# Patient Record
Sex: Female | Born: 1970 | Race: White | Hispanic: No | Smoking: Former smoker
Health system: Southern US, Community
[De-identification: ages and names within clinical notes are randomized; demographics above are authoritative.]

## PROBLEM LIST (undated history)

## (undated) DIAGNOSIS — K219 Gastro-esophageal reflux disease without esophagitis: Secondary | ICD-10-CM

## (undated) DIAGNOSIS — F32A Depression, unspecified: Secondary | ICD-10-CM

## (undated) DIAGNOSIS — D649 Anemia, unspecified: Secondary | ICD-10-CM

## (undated) DIAGNOSIS — F988 Other specified behavioral and emotional disorders with onset usually occurring in childhood and adolescence: Secondary | ICD-10-CM

## (undated) DIAGNOSIS — J45909 Unspecified asthma, uncomplicated: Secondary | ICD-10-CM

## (undated) DIAGNOSIS — F329 Major depressive disorder, single episode, unspecified: Secondary | ICD-10-CM

## (undated) HISTORY — PX: BLADDER SURGERY: SHX569

## (undated) HISTORY — DX: Gastro-esophageal reflux disease without esophagitis: K21.9

## (undated) HISTORY — DX: Unspecified asthma, uncomplicated: J45.909

## (undated) HISTORY — DX: Major depressive disorder, single episode, unspecified: F32.9

## (undated) HISTORY — DX: Depression, unspecified: F32.A

## (undated) HISTORY — DX: Anemia, unspecified: D64.9

## (undated) HISTORY — PX: MIDDLE EAR SURGERY: SHX713

## (undated) HISTORY — PX: EXTERNAL EAR SURGERY: SHX627

## (undated) HISTORY — PX: OTHER SURGICAL HISTORY: SHX169

## (undated) HISTORY — DX: Other specified behavioral and emotional disorders with onset usually occurring in childhood and adolescence: F98.8

## (undated) HISTORY — PX: ADENOIDECTOMY: SUR15

## (undated) HISTORY — PX: INNER EAR SURGERY: SHX679

---

## 1992-07-17 HISTORY — PX: BREAST CYST EXCISION: SHX579

## 2004-01-20 ENCOUNTER — Encounter: Admission: RE | Admit: 2004-01-20 | Discharge: 2004-01-20 | Payer: Self-pay | Admitting: Internal Medicine

## 2013-05-28 ENCOUNTER — Telehealth: Payer: Self-pay | Admitting: *Deleted

## 2013-05-28 MED ORDER — AMPHETAMINE-DEXTROAMPHETAMINE 20 MG PO TABS: 20.0000 mg | ORAL_TABLET | Freq: Two times a day (BID) | ORAL | Status: DC

## 2013-05-28 NOTE — Telephone Encounter (Signed)
REFILL = ADDERALL 20MG    CALL PT WHEN READY .

## 2013-06-25 ENCOUNTER — Telehealth: Payer: Self-pay

## 2013-06-25 ENCOUNTER — Other Ambulatory Visit: Payer: Self-pay | Admitting: Physician Assistant

## 2013-06-25 MED ORDER — AMPHETAMINE-DEXTROAMPHETAMINE 20 MG PO TABS: 20.0000 mg | ORAL_TABLET | Freq: Two times a day (BID) | ORAL | Status: DC

## 2013-06-25 NOTE — Telephone Encounter (Signed)
Refill is ready for pt to pick up. lmom to pt.

## 2013-07-14 ENCOUNTER — Encounter: Payer: Self-pay | Admitting: Internal Medicine

## 2013-07-15 ENCOUNTER — Ambulatory Visit: Payer: Self-pay | Admitting: Emergency Medicine

## 2013-07-16 ENCOUNTER — Ambulatory Visit (INDEPENDENT_AMBULATORY_CARE_PROVIDER_SITE_OTHER): Payer: Self-pay | Admitting: Physician Assistant

## 2013-07-16 ENCOUNTER — Encounter: Payer: Self-pay | Admitting: Physician Assistant

## 2013-07-16 VITALS — BP 122/78 | HR 84 | Temp 99.1°F | Resp 16 | Ht 64.5 in | Wt 210.0 lb

## 2013-07-16 DIAGNOSIS — J019 Acute sinusitis, unspecified: Secondary | ICD-10-CM

## 2013-07-16 MED ORDER — BENZONATATE 100 MG PO CAPS: 100.0000 mg | ORAL_CAPSULE | Freq: Four times a day (QID) | ORAL | Status: DC | PRN

## 2013-07-16 MED ORDER — PREDNISONE 20 MG PO TABS: ORAL_TABLET | ORAL | Status: DC

## 2013-07-16 MED ORDER — AZITHROMYCIN 250 MG PO TABS: ORAL_TABLET | ORAL | Status: DC

## 2013-07-16 NOTE — Patient Instructions (Signed)

## 2013-07-16 NOTE — Progress Notes (Signed)
   Subjective:    Patient ID: Vanessa Lee, female    DOB: February 12, 1971, 42 y.o.   MRN: [id number]  Sinusitis This is a new problem. Episode onset: sunday. The problem has been gradually worsening since onset. There has been no fever (not checking). She is experiencing no pain. Associated symptoms include chills, congestion, coughing, ear pain, shortness of breath, sinus pressure, sneezing, a sore throat and swollen glands. Pertinent negatives include no diaphoresis, headaches, hoarse voice or neck pain. Past treatments include acetaminophen, saline sprays and spray decongestants. The treatment provided no relief.    Review of Systems  Constitutional: Positive for chills and fatigue. Negative for fever and diaphoresis.  HENT: Positive for congestion, ear pain, sinus pressure, sneezing and sore throat. Negative for hoarse voice, mouth sores, nosebleeds, postnasal drip, rhinorrhea, tinnitus, trouble swallowing and voice change.   Eyes: Negative.   Respiratory: Positive for cough and shortness of breath. Negative for chest tightness, wheezing and stridor.   Cardiovascular: Negative.   Gastrointestinal: Negative.   Genitourinary: Negative.   Musculoskeletal: Positive for myalgias. Negative for neck pain.  Neurological: Negative.  Negative for headaches.       Objective:   Physical Exam  Constitutional: She appears well-developed and well-nourished.  HENT:  Head: Normocephalic and atraumatic.  Right Ear: External ear normal.  Nose: Right sinus exhibits frontal sinus tenderness. Left sinus exhibits frontal sinus tenderness.  Eyes: Conjunctivae and EOM are normal.  Neck: Normal range of motion. Neck supple.  Cardiovascular: Normal rate, regular rhythm, normal heart sounds and intact distal pulses.   Pulmonary/Chest: Effort normal and breath sounds normal. No respiratory distress. She has no wheezes.  Abdominal: Soft. Bowel sounds are normal.  Lymphadenopathy:    She has cervical adenopathy.   Skin: Skin is warm and dry.      Assessment & Plan:  Acute sinusitis, unspecified - Plan: azithromycin (ZITHROMAX) 250 MG tablet, predniSONE (DELTASONE) 20 MG tablet, benzonatate (TESSALON PERLES) 100 MG capsule  Will hold the antibiotic and treat symptoms for now.

## 2013-08-04 ENCOUNTER — Other Ambulatory Visit: Payer: Self-pay | Admitting: Physician Assistant

## 2013-08-04 MED ORDER — AMPHETAMINE-DEXTROAMPHETAMINE 20 MG PO TABS: 20.0000 mg | ORAL_TABLET | Freq: Two times a day (BID) | ORAL | Status: DC

## 2013-08-21 ENCOUNTER — Ambulatory Visit: Payer: Self-pay | Admitting: Physician Assistant

## 2013-08-25 ENCOUNTER — Encounter: Payer: Self-pay | Admitting: Physician Assistant

## 2013-08-25 ENCOUNTER — Ambulatory Visit (INDEPENDENT_AMBULATORY_CARE_PROVIDER_SITE_OTHER): Payer: Self-pay | Admitting: Physician Assistant

## 2013-08-25 VITALS — BP 110/72 | HR 80 | Temp 98.1°F | Resp 16 | Ht 64.5 in | Wt 198.0 lb

## 2013-08-25 DIAGNOSIS — F988 Other specified behavioral and emotional disorders with onset usually occurring in childhood and adolescence: Secondary | ICD-10-CM

## 2013-08-25 DIAGNOSIS — J0100 Acute maxillary sinusitis, unspecified: Secondary | ICD-10-CM

## 2013-08-25 MED ORDER — FLUCONAZOLE 150 MG PO TABS: 150.0000 mg | ORAL_TABLET | Freq: Every day | ORAL | Status: DC

## 2013-08-25 MED ORDER — SULFAMETHOXAZOLE-TMP DS 800-160 MG PO TABS: 1.0000 | ORAL_TABLET | Freq: Two times a day (BID) | ORAL | Status: DC

## 2013-08-25 MED ORDER — PREDNISONE 20 MG PO TABS: ORAL_TABLET | ORAL | Status: DC

## 2013-08-25 MED ORDER — AMPHETAMINE-DEXTROAMPHETAMINE 20 MG PO TABS: 20.0000 mg | ORAL_TABLET | Freq: Two times a day (BID) | ORAL | Status: DC

## 2013-08-25 NOTE — Patient Instructions (Signed)
Please take the prednisone to help decrease inflammation and therefore decrease symptoms. Take it it with food to avoid GI upset. It can cause increased energy but on the other hand it can make it hard to sleep at night so please take it in the morning.  It is not an antibiotic so you can stop it early if you are feeling better.  If you are diabetic it will increase your sugars.   The majority of colds are caused by viruses and do not require antibiotics. Please read the rest of this hand out to learn more about the common cold and what you can do to help yourself as well as help prevent the over use of antibiotics.   COMMON COLD SIGNS AND SYMPTOMS - The common cold usually causes nasal congestion, runny nose, and sneezing. A sore throat may be present on the first day but usually resolves quickly. If a cough occurs, it generally develops on about the fourth or fifth day of symptoms, typically when congestion and runny nose are resolving  COMMON COLD COMPLICATIONS - In most cases, colds do not cause serious illness or complications. Most colds last for three to seven days, although many people continue to have symptoms (coughing, sneezing, congestion) for up to two weeks.  One of the more common complications is sinusitis, which is usually caused by viruses and rarely (about 2 percent of the time) by bacteria. Having thick or yellow to green-colored nasal discharge does not mean that bacterial sinusitis has developed; discolored nasal discharge is a normal phase of the common cold.  Lower respiratory infections, such as pneumonia or bronchitis, may develop following a cold.  Infection of the middle ear, or otitis media, can accompany or follow a cold.  COMMON COLD TREATMENT - There is no specific treatment for the viruses that cause the common cold. Most treatments are aimed at relieving some of the symptoms of the cold, but do not shorten or cure the cold. Antibiotics are not useful for treating the  common cold; antibiotics are only used to treat illnesses caused by bacteria, not viruses. Unnecessary use of antibiotics for the treatment of the common cold can cause allergic reactions, diarrhea, or other gastrointestinal symptoms in some patients.  The symptoms of a cold will resolve over time, even without any treatment. People with underlying medical conditions and those who use other over-the-counter or prescription medications should speak with their healthcare provider or pharmacist to ensure that it is safe to use these treatments. The following are treatments that may reduce the symptoms caused by the common cold.  Nasal congestion - Decongestants are good for nasal congestion- if you feel very stuffy but no mucus is coming out, this is the medication that will help you the most.  Pseudoephedrine is a decongestant that can improve nasal congestion. Although a prescription is not required, drugstores in the [place] keep pseudoephedrine behind the counter, so it must be requested from a pharmacist. If you have a heart condition or high blood pressure please use Coricidin BPH instead.   Runny nose - Antihistamines such as diphenhydramine (Benadryl), certazine (Zyrtec) which are best taking at night because they can make you tired OR loratadine (Claritin),  fexafinadine (Allegra) help with a runny nose.   Nasal sprays such an oxymetazoline (Afrin and others) may also give temporary relief of nasal congestion. However, these sprays should never be used for more than two to three days; use for more than three days use can worsen congestion.    Nasocort is now over the counter and can help decrease a runny nose. Please stop the medication if you have blurry vision or nose bleeds.   Sore throat and headache - Sore throat and headache are best treated with a mild pain reliever such as acetaminophen (Tylenol) or a non-steroidal anti-inflammatory agent such as ibuprofen or naproxen (Motrin or Aleve).  These medications should be taken with food to prevent stomach problems. As well as gargling with warm water and salt.   Cough - Common cough medicine ingredients include guaifenesin and dextromethorphan; these are often combined with other medications in over-the-counter cold formulas. Often a cough is worse at night or first in the morning due to post nasal drip from you nose. You can try to sleep at an angle to decrease a cough.   Alternative treatments - Heated, humidified air can improve symptoms of nasal congestion and runny nose, and causes few to no side effects. A number of alternative products, including vitamin C, doubling up on your vitamin D and herbal products such as echinacea, may help. Certain products, such as nasal gels that contain zinc (eg, Zicam), have been associated with a permanent loss of smell.  Antibiotics - Antibiotics should not be used to treat an uncomplicated common cold. As noted above, colds are caused by viruses. Antibiotics treat bacterial, not viral infections. Some viruses that cause the common cold can also depress the immune system or cause swelling in the lining of the nose or airways; this can, in turn, lead to a bacterial infection. Often you need to give your body 7 days to fight off a common cold while treating the symptoms with the medications listed above. If after 7 days your symptoms are not improving, you are getting worse, you have shortness of breath, chest pain, a fever of over 103 you should seek medical help immediately.   PREVENTION IS THE BEST MEDICINE - Hand washing is an essential and highly effective way to prevent the spread of infection.  Alcohol-based hand rubs are a good alternative for disinfecting hands if a sink is not available.  Hands should be washed before preparing food and eating and after coughing, blowing the nose, or sneezing. While it is not always possible to limit contact with people who may be infected with a cold, touching  the eyes, nose, or mouth after direct contact should be avoided when possible. Sneezing/coughing into the sleeve of one's clothing (at the inner elbow) is another means of containing sprays of saliva and secretions and does not contaminate the hands.   Smoking Cessation Quitting smoking is important to your health and has many advantages. However, it is not always easy to quit since nicotine is a very addictive drug. Often times, people try 3 times or more before being able to quit. This document explains the best ways for you to prepare to quit smoking. Quitting takes hard work and a lot of effort, but you can do it. ADVANTAGES OF QUITTING SMOKING  You will live longer, feel better, and live better.  Your body will feel the impact of quitting smoking almost immediately.  Within 20 minutes, blood pressure decreases. Your pulse returns to its normal level.  After 8 hours, carbon monoxide levels in the blood return to normal. Your oxygen level increases.  After 24 hours, the chance of having a heart attack starts to decrease. Your breath, hair, and body stop smelling like smoke.  After 48 hours, damaged nerve endings begin to recover. Your sense of   taste and smell improve.  After 72 hours, the body is virtually free of nicotine. Your bronchial tubes relax and breathing becomes easier.  After 2 to 12 weeks, lungs can hold more air. Exercise becomes easier and circulation improves.  The risk of having a heart attack, stroke, cancer, or lung disease is greatly reduced.  After 1 year, the risk of coronary heart disease is cut in half.  After 5 years, the risk of stroke falls to the same as a nonsmoker.  After 10 years, the risk of lung cancer is cut in half and the risk of other cancers decreases significantly.  After 15 years, the risk of coronary heart disease drops, usually to the level of a nonsmoker.  If you are pregnant, quitting smoking will improve your chances of having a healthy  baby.  The people you live with, especially any children, will be healthier.  You will have extra money to spend on things other than cigarettes. QUESTIONS TO THINK ABOUT BEFORE ATTEMPTING TO QUIT You may want to talk about your answers with your caregiver.  Why do you want to quit?  If you tried to quit in the past, what helped and what did not?  What will be the most difficult situations for you after you quit? How will you plan to handle them?  Who can help you through the tough times? Your family? Friends? A caregiver?  What pleasures do you get from smoking? What ways can you still get pleasure if you quit? Here are some questions to ask your caregiver:  How can you help me to be successful at quitting?  What medicine do you think would be best for me and how should I take it?  What should I do if I need more help?  What is smoking withdrawal like? How can I get information on withdrawal? GET READY  Set a quit date.  Change your environment by getting rid of all cigarettes, ashtrays, matches, and lighters in your home, car, or work. Do not let people smoke in your home.  Review your past attempts to quit. Think about what worked and what did not. GET SUPPORT AND ENCOURAGEMENT You have a better chance of being successful if you have help. You can get support in many ways.  Tell your family, friends, and co-workers that you are going to quit and need their support. Ask them not to smoke around you.  Get individual, group, or telephone counseling and support. Programs are available at local hospitals and health centers. Call your local health department for information about programs in your area.  Spiritual beliefs and practices may help some smokers quit.  Download a "quit meter" on your computer to keep track of quit statistics, such as how long you have gone without smoking, cigarettes not smoked, and money saved.  Get a self-help book about quitting smoking and  staying off of tobacco. LEARN NEW SKILLS AND BEHAVIORS  Distract yourself from urges to smoke. Talk to someone, go for a walk, or occupy your time with a task.  Change your normal routine. Take a different route to work. Drink tea instead of coffee. Eat breakfast in a different place.  Reduce your stress. Take a hot bath, exercise, or read a book.  Plan something enjoyable to do every day. Reward yourself for not smoking.  Explore interactive web-based programs that specialize in helping you quit. GET MEDICINE AND USE IT CORRECTLY Medicines can help you stop smoking and decrease the urge to   smoke. Combining medicine with the above behavioral methods and support can greatly increase your chances of successfully quitting smoking.  Nicotine replacement therapy helps deliver nicotine to your body without the negative effects and risks of smoking. Nicotine replacement therapy includes nicotine gum, lozenges, inhalers, nasal sprays, and skin patches. Some may be available over-the-counter and others require a prescription.  Antidepressant medicine helps people abstain from smoking, but how this works is unknown. This medicine is available by prescription.  Nicotinic receptor partial agonist medicine simulates the effect of nicotine in your brain. This medicine is available by prescription. Ask your caregiver for advice about which medicines to use and how to use them based on your health history. Your caregiver will tell you what side effects to look out for if you choose to be on a medicine or therapy. Carefully read the information on the package. Do not use any other product containing nicotine while using a nicotine replacement product.  RELAPSE OR DIFFICULT SITUATIONS Most relapses occur within the first 3 months after quitting. Do not be discouraged if you start smoking again. Remember, most people try several times before finally quitting. You may have symptoms of withdrawal because your body  is used to nicotine. You may crave cigarettes, be irritable, feel very hungry, cough often, get headaches, or have difficulty concentrating. The withdrawal symptoms are only temporary. They are strongest when you first quit, but they will go away within 10 14 days. To reduce the chances of relapse, try to:  Avoid drinking alcohol. Drinking lowers your chances of successfully quitting.  Reduce the amount of caffeine you consume. Once you quit smoking, the amount of caffeine in your body increases and can give you symptoms, such as a rapid heartbeat, sweating, and anxiety.  Avoid smokers because they can make you want to smoke.  Do not let weight gain distract you. Many smokers will gain weight when they quit, usually less than 10 pounds. Eat a healthy diet and stay active. You can always lose the weight gained after you quit.  Find ways to improve your mood other than smoking. FOR MORE INFORMATION  www.smokefree.gov  Document Released: 06/27/2001 Document Revised: 01/02/2012 Document Reviewed: 10/12/2011 Exit[employer] Patient Information 2014 [employer].  

## 2013-08-25 NOTE — Progress Notes (Signed)
   Subjective:    Patient ID: Vanessa Lee, female    DOB: 01-23-1971, 43 y.o.   MRN: [id number]  URI  This is a new problem. The current episode started 1 to 4 weeks ago. The problem has been gradually worsening. There has been no fever. Associated symptoms include congestion, coughing, ear pain, a plugged ear sensation, sinus pain, a sore throat and swollen glands. Pertinent negatives include no abdominal pain, chest pain, diarrhea, dysuria, headaches, joint pain, joint swelling, nausea, neck pain, rash, rhinorrhea, sneezing, vomiting or wheezing. She has tried acetaminophen and antihistamine for the symptoms. The treatment provided no relief.     Review of Systems  Constitutional: Negative.   HENT: Positive for congestion, ear pain and sore throat. Negative for rhinorrhea and sneezing.   Respiratory: Positive for cough. Negative for wheezing.   Cardiovascular: Negative.  Negative for chest pain.  Gastrointestinal: Negative.  Negative for nausea, vomiting, abdominal pain and diarrhea.  Genitourinary: Negative.  Negative for dysuria.  Musculoskeletal: Negative.  Negative for joint pain and neck pain.  Skin: Negative.  Negative for rash.  Neurological: Negative.  Negative for headaches.       Objective:   Physical Exam  Constitutional: She appears well-developed and well-nourished.  HENT:  Head: Normocephalic and atraumatic.  Right Ear: Tympanic membrane is perforated. Decreased hearing is noted.  Left Ear: A middle ear effusion is present. Decreased hearing is noted.  Nose: Right sinus exhibits maxillary sinus tenderness. Left sinus exhibits maxillary sinus tenderness.  Eyes: Conjunctivae are normal. Pupils are equal, round, and reactive to light.  Neck: Normal range of motion. Neck supple.  Cardiovascular: Normal rate and regular rhythm.   Pulmonary/Chest: Effort normal and breath sounds normal.  Abdominal: Soft. Bowel sounds are normal.       Assessment & Plan:  Acute  maxillary sinusitis - Plan: sulfamethoxazole-trimethoprim (BACTRIM DS) 800-160 MG per tablet, predniSONE (DELTASONE) 20 MG tablet, fluconazole (DIFLUCAN) 150 MG tablet  ADD (attention deficit disorder)- refilled adderall 20 BID

## 2013-09-22 ENCOUNTER — Other Ambulatory Visit: Payer: Self-pay | Admitting: Physician Assistant

## 2013-09-29 ENCOUNTER — Other Ambulatory Visit: Payer: Self-pay | Admitting: Physician Assistant

## 2013-09-29 MED ORDER — AMPHETAMINE-DEXTROAMPHETAMINE 20 MG PO TABS: 20.0000 mg | ORAL_TABLET | Freq: Two times a day (BID) | ORAL | Status: DC

## 2013-10-05 ENCOUNTER — Other Ambulatory Visit: Payer: Self-pay | Admitting: Physician Assistant

## 2013-10-28 ENCOUNTER — Other Ambulatory Visit: Payer: Self-pay | Admitting: Physician Assistant

## 2013-10-28 MED ORDER — AMPHETAMINE-DEXTROAMPHETAMINE 20 MG PO TABS: 20.0000 mg | ORAL_TABLET | Freq: Two times a day (BID) | ORAL | Status: DC

## 2013-11-24 ENCOUNTER — Encounter: Payer: Self-pay | Admitting: Physician Assistant

## 2013-11-24 ENCOUNTER — Ambulatory Visit (INDEPENDENT_AMBULATORY_CARE_PROVIDER_SITE_OTHER): Payer: Self-pay | Admitting: Physician Assistant

## 2013-11-24 VITALS — BP 120/70 | HR 88 | Temp 98.9°F | Resp 16 | Wt 191.0 lb

## 2013-11-24 DIAGNOSIS — J029 Acute pharyngitis, unspecified: Secondary | ICD-10-CM

## 2013-11-24 MED ORDER — AZITHROMYCIN 250 MG PO TABS: ORAL_TABLET | ORAL | Status: DC

## 2013-11-24 MED ORDER — ANTIPYRINE-BENZOCAINE 54-14 MG/ML OT SOLN
2.0000 [drp] | OTIC | Status: DC | PRN
Start: 2013-11-24 — End: 2015-03-24

## 2013-11-24 NOTE — Patient Instructions (Signed)
Pharyngitis °Pharyngitis is redness, pain, and swelling (inflammation) of your pharynx.  °CAUSES  °Pharyngitis is usually caused by infection. Most of the time, these infections are from viruses (viral) and are part of a cold. However, sometimes pharyngitis is caused by bacteria (bacterial). Pharyngitis can also be caused by allergies. Viral pharyngitis may be spread from person to person by coughing, sneezing, and personal items or utensils (cups, forks, spoons, toothbrushes). Bacterial pharyngitis may be spread from person to person by more intimate contact, such as kissing.  °SIGNS AND SYMPTOMS  °Symptoms of pharyngitis include:   °· Sore throat.   °· Tiredness (fatigue).   °· Low-grade fever.   °· Headache. °· Joint pain and muscle aches. °· Skin rashes. °· Swollen lymph nodes. °· Plaque-like film on throat or tonsils (often seen with bacterial pharyngitis). °DIAGNOSIS  °Your health care provider will ask you questions about your illness and your symptoms. Your medical history, along with a physical exam, is often all that is needed to diagnose pharyngitis. Sometimes, a rapid strep test is done. Other lab tests may also be done, depending on the suspected cause.  °TREATMENT  °Viral pharyngitis will usually get better in 3 4 days without the use of medicine. Bacterial pharyngitis is treated with medicines that kill germs (antibiotics).  °HOME CARE INSTRUCTIONS  °· Drink enough water and fluids to keep your urine clear or pale yellow.   °· Only take over-the-counter or prescription medicines as directed by your health care provider:   °· If you are prescribed antibiotics, make sure you finish them even if you start to feel better.   °· Do not take aspirin.   °· Get lots of rest.   °· Gargle with 8 oz of salt water (½ tsp of salt per 1 qt of water) as often as every 1 2 hours to soothe your throat.   °· Throat lozenges (if you are not at risk for choking) or sprays may be used to soothe your throat. °SEEK MEDICAL  CARE IF:  °· You have large, tender lumps in your neck. °· You have a rash. °· You cough up green, yellow-brown, or bloody spit. °SEEK IMMEDIATE MEDICAL CARE IF:  °· Your neck becomes stiff. °· You drool or are unable to swallow liquids. °· You vomit or are unable to keep medicines or liquids down. °· You have severe pain that does not go away with the use of recommended medicines. °· You have trouble breathing (not caused by a stuffy nose). °MAKE SURE YOU:  °· Understand these instructions. °· Will watch your condition. °· Will get help right away if you are not doing well or get worse. °Document Released: 07/03/2005 Document Revised: 04/23/2013 Document Reviewed: 03/10/2013 °ExitCare® Patient Information ©2014 [employer]. ° °

## 2013-11-24 NOTE — Progress Notes (Signed)
   Subjective:    Patient ID: Vanessa Lee, female    DOB: Sep 16, 1970, 43 y.o.   MRN: [id number]  Otalgia  There is pain in the right ear. This is a new problem. Episode onset: 5 days. The problem occurs constantly. The problem has been gradually worsening. There has been no fever. The pain is mild. Associated symptoms include coughing, rhinorrhea and a sore throat. Pertinent negatives include no abdominal pain, diarrhea, drainage, ear discharge, headaches, hearing loss, neck pain, rash or vomiting. She has tried nothing for the symptoms. Son with strep    Review of Systems  Constitutional: Negative.   HENT: Positive for ear pain, rhinorrhea and sore throat. Negative for ear discharge and hearing loss.   Eyes: Negative.   Respiratory: Positive for cough.   Gastrointestinal: Negative for vomiting, abdominal pain and diarrhea.  Musculoskeletal: Negative for neck pain.  Skin: Negative for rash.  Neurological: Negative for headaches.       Objective:   Physical Exam  Constitutional: She appears well-developed and well-nourished.  HENT:  Head: Normocephalic and atraumatic.  Right Ear: External ear normal.  Left Ear: External ear normal.  Mouth/Throat: Uvula is midline and mucous membranes are normal. Posterior oropharyngeal edema and posterior oropharyngeal erythema present.  Eyes: Conjunctivae and EOM are normal. Pupils are equal, round, and reactive to light.  Neck: Normal range of motion. Neck supple.  Cardiovascular: Normal rate, regular rhythm and normal heart sounds.   Pulmonary/Chest: Effort normal and breath sounds normal.  Abdominal: Soft. Bowel sounds are normal.  Lymphadenopathy:    She has cervical adenopathy.  Skin: Skin is warm and dry.      Assessment & Plan:  Acute pharyngitis - Plan: azithromycin (ZITHROMAX) 250 MG tablet, Antipyrine-Benzocaine (AURALGAN) 54-14 MG/ML SOLN

## 2013-12-01 ENCOUNTER — Other Ambulatory Visit: Payer: Self-pay | Admitting: Physician Assistant

## 2013-12-01 MED ORDER — AMPHETAMINE-DEXTROAMPHETAMINE 20 MG PO TABS: 20.0000 mg | ORAL_TABLET | Freq: Two times a day (BID) | ORAL | Status: DC

## 2013-12-18 ENCOUNTER — Other Ambulatory Visit: Payer: Self-pay | Admitting: Physician Assistant

## 2013-12-31 ENCOUNTER — Other Ambulatory Visit: Payer: Self-pay | Admitting: Physician Assistant

## 2013-12-31 MED ORDER — AMPHETAMINE-DEXTROAMPHETAMINE 20 MG PO TABS: 20.0000 mg | ORAL_TABLET | Freq: Two times a day (BID) | ORAL | Status: DC

## 2014-01-29 ENCOUNTER — Other Ambulatory Visit: Payer: Self-pay | Admitting: Emergency Medicine

## 2014-01-29 MED ORDER — AMPHETAMINE-DEXTROAMPHETAMINE 20 MG PO TABS: 20.0000 mg | ORAL_TABLET | Freq: Two times a day (BID) | ORAL | Status: DC

## 2014-02-23 ENCOUNTER — Ambulatory Visit (INDEPENDENT_AMBULATORY_CARE_PROVIDER_SITE_OTHER): Payer: Self-pay | Admitting: Physician Assistant

## 2014-02-23 ENCOUNTER — Encounter: Payer: Self-pay | Admitting: Physician Assistant

## 2014-02-23 VITALS — BP 132/78 | HR 84 | Temp 98.1°F | Resp 16 | Wt 189.0 lb

## 2014-02-23 DIAGNOSIS — Z79899 Other long term (current) drug therapy: Secondary | ICD-10-CM

## 2014-02-23 DIAGNOSIS — F988 Other specified behavioral and emotional disorders with onset usually occurring in childhood and adolescence: Secondary | ICD-10-CM

## 2014-02-23 DIAGNOSIS — J019 Acute sinusitis, unspecified: Secondary | ICD-10-CM

## 2014-02-23 LAB — CBC WITH DIFFERENTIAL/PLATELET
BASOS ABS: 0 10*3/uL (ref 0.0–0.1)
Basophils Relative: 0 % (ref 0–1)
Eosinophils Absolute: 0.1 10*3/uL (ref 0.0–0.7)
Eosinophils Relative: 1 % (ref 0–5)
HCT: 41.6 % (ref 36.0–46.0)
Hemoglobin: 13.3 g/dL (ref 12.0–15.0)
LYMPHS ABS: 1.3 10*3/uL (ref 0.7–4.0)
Lymphocytes Relative: 17 % (ref 12–46)
MCH: 26 pg (ref 26.0–34.0)
MCHC: 32 g/dL (ref 30.0–36.0)
MCV: 81.3 fL (ref 78.0–100.0)
MONO ABS: 0.6 10*3/uL (ref 0.1–1.0)
Monocytes Relative: 8 % (ref 3–12)
NEUTROS PCT: 74 % (ref 43–77)
Neutro Abs: 5.6 10*3/uL (ref 1.7–7.7)
Platelets: 331 10*3/uL (ref 150–400)
RBC: 5.12 MIL/uL — AB (ref 3.87–5.11)
RDW: 15.6 % — ABNORMAL HIGH (ref 11.5–15.5)
WBC: 7.5 10*3/uL (ref 4.0–10.5)

## 2014-02-23 LAB — COMPREHENSIVE METABOLIC PANEL
ALBUMIN: 4.4 g/dL (ref 3.5–5.2)
ALK PHOS: 82 U/L (ref 39–117)
ALT: 17 U/L (ref 0–35)
AST: 33 U/L (ref 0–37)
BILIRUBIN TOTAL: 0.5 mg/dL (ref 0.2–1.2)
BUN: 6 mg/dL (ref 6–23)
CALCIUM: 9.1 mg/dL (ref 8.4–10.5)
CO2: 26 meq/L (ref 19–32)
CREATININE: 0.54 mg/dL (ref 0.50–1.10)
Chloride: 103 mEq/L (ref 96–112)
GLUCOSE: 86 mg/dL (ref 70–99)
Potassium: 4.3 mEq/L (ref 3.5–5.3)
Sodium: 136 mEq/L (ref 135–145)
TOTAL PROTEIN: 7.2 g/dL (ref 6.0–8.3)

## 2014-02-23 MED ORDER — AMPHETAMINE-DEXTROAMPHETAMINE 20 MG PO TABS: 20.0000 mg | ORAL_TABLET | Freq: Two times a day (BID) | ORAL | Status: DC

## 2014-02-23 MED ORDER — ALPRAZOLAM 0.5 MG PO TABS: 0.5000 mg | ORAL_TABLET | Freq: Two times a day (BID) | ORAL | Status: DC | PRN

## 2014-02-23 MED ORDER — AZITHROMYCIN 250 MG PO TABS: ORAL_TABLET | ORAL | Status: DC

## 2014-02-23 NOTE — Patient Instructions (Addendum)
Can get pedometer and try to get 10,000 steps daily.   We want weight loss that will last so you should lose 1-2 pounds a week.  THAT IS IT! Please pick THREE things a month to change. Once it is a habit check off the item. Then pick another three items off the list to become habits.  If you are already doing a habit on the list GREAT!  Cross that item off! o Don't drink your calories. Ie, alcohol, soda, fruit juice, and sweet tea.  o Drink more water. Drink a glass when you feel hungry or before each meal.  o Eat breakfast - Complex carb and protein (likeDannon light and fit yogurt, oatmeal, fruit, eggs, [place] bacon). o Measure your cereal.  Eat no more than one cup a day. (ie [place]) o Eat an apple a day. o Add a vegetable a day. o Try a new vegetable a month. o Use Pam! Stop using oil or butter to cook. o Don't finish your plate or use smaller plates. o Share your dessert. o Eat sugar free Jello for dessert or frozen grapes. o Don't eat 2-3 hours before bed. o Switch to whole wheat bread, pasta, and brown rice. o Make healthier choices when you eat out. No fries! o Pick baked chicken, NOT fried. o Don't forget to SLOW DOWN when you eat. It is not going anywhere.  o Take the stairs. o Park far away in the parking lot o [employer] soup cans (or weights) for 10 minutes while watching TV. o Walk at work for 10 minutes during break. o Walk outside 1 time a week with your friend, kids, dog, or significant other. o Start a walking group at church. o Walk the mall as much as you can tolerate.  o Keep a food diary. o Weigh yourself daily. o Walk for 15 minutes 3 days per week. o Cook at home more often and eat out less.  If life happens and you go back to old habits, it is okay.  Just start over. You can do it!   If you experience chest pain, get short of breath, or tired during the exercise, please stop immediately and inform your doctor.

## 2014-02-23 NOTE — Progress Notes (Signed)
Assessment and Plan:  Obesity with co morbidities- long discussion about weight loss, diet, and exercise ADD-  Continue ADD medication, helps with focus, no AE's. The patient was counseled on the addictive nature of the medication and was encouraged to take drug holidays when not needed.  Depression/Anxiety- given very low dose xanax, long discussion addictive nature, stress management techniques discussed, increase water, good sleep hygiene discussed, increase exercise, and increase veggies.   Continue diet and meds as discussed. Further disposition pending results of labs.  HPI 43 y.o. female  presents for 3 month follow up with obesity, ADD meds, and anxiety/depression. Her blood pressure has been controlled at home, today their BP is BP: 132/78 mmHg She does not workout, she walks at work and states "she never sits down". She denies chest pain, shortness of breath, dizziness.  She BMI is Body mass index is 31.95 kg/(m^2)., she is working on diet and exercise and has done well.  Wt Readings from Last 3 Encounters:  02/23/14 189 lb (85.73 kg)  11/24/13 191 lb (86.637 kg)  08/25/13 198 lb (89.812 kg)  Patient is on an ADD medication, she states that the medication is helping and she denies any adverse reactions.  She is still smoking, has recurrent sinus infections. Has had sinus pain, yellow mucus for 1 week.  She states that she has been under a lot of stress, she states that her son, who is far, will go from nice to very violent. He will hit his sister, has ripped door of hinge and throw at the window. She is on Buspar for anxiety but states it is not helping.   Current Medications:  Current Outpatient Prescriptions on File Prior to Visit  Medication Sig Dispense Refill  . amphetamine-dextroamphetamine (ADDERALL) 20 MG tablet Take 1 tablet (20 mg total) by mouth 2 (two) times daily.  60 tablet  0  . Antipyrine-Benzocaine (AURALGAN) 54-14 MG/ML SOLN Place 2 drops into the right ear every 2  (two) hours as needed for pain.  1 Bottle  2  . citalopram (CELEXA) 40 MG tablet TAKE ONE TABLET BY MOUTH ONCE DAILY  90 tablet  0  . esomeprazole (NEXIUM) 20 MG capsule Take 20 mg by mouth daily at 12 noon. OTC      . sertraline (ZOLOFT) 100 MG tablet TAKE TWO TABLETS BY MOUTH ONCE DAILY  60 tablet  0   No current facility-administered medications on file prior to visit.   Medical History:  Past Medical History  Diagnosis Date  . Asthma   . Depression   . GERD (gastroesophageal reflux disease)   . Anemia   . Hypertension   . ADD (attention deficit disorder)    Allergies: No Known Allergies   Review of Systems: [X]  = complains of  [ ]  = denies  General: Fatigue [ ]  Fever [ ]  Chills [ ]  Weakness [ ]   Insomnia [ ]  Eyes: Redness [ ]  Blurred vision [ ]  Diplopia [ ]   ENT: Congestion Arly.Keller ] Sinus Pain Arly.Keller ] Post Nasal Drip Arly.Keller ] Sore Throat [ ]  Earache [ ]   Cardiac: Chest pain/pressure [ ]  SOB [ ]  Orthopnea [ ]   Palpitations [ ]   Paroxysmal nocturnal dyspnea[ ]  Claudication [ ]  Edema [ ]   Pulmonary: Cough Arly.Keller ] Wheezing[ ]   SOB [ ]   Snoring [ ]   GI: Nausea [ ]  Vomiting[ ]  Dysphagia[ ]  Heartburn[ ]  Abdominal pain [ ]  Constipation [ ] ; Diarrhea [ ] ; BRBPR [ ]  Melena[ ]  GU:  Hematuria[ ]  Dysuria [ ]  Nocturia[ ]  Urgency [ ]   Hesitancy [ ]  Discharge [ ]  Neuro: Headaches[ ]  Vertigo[ ]  Paresthesias[ ]  Spasm [ ]  Speech changes [ ]  Incoordination [ ]   Ortho: Arthritis [ ]  Joint pain [ ]  Muscle pain [ ]  Joint swelling [ ]  Back Pain [ ]  Skin:  Rash [ ]   Pruritis [ ]  Change in skin lesion [ ]   Psych: Depression[X]  Anxiety[X ] Confusion [ ]  Memory loss [ ]   Heme/Lypmh: Bleeding [ ]  Bruising [ ]  Enlarged lymph nodes [ ]   Endocrine: Visual blurring [ ]  Paresthesia [ ]  Polyuria [ ]  Polydypsea [ ]    Heat/cold intolerance [ ]  Hypoglycemia [ ]   Family history- Review and unchanged Social history- Review and unchanged Physical Exam: BP 132/78  Pulse 84  Temp(Src) 98.1 F (36.7 C)  Resp 16  Wt 189 lb  (85.73 kg) Wt Readings from Last 3 Encounters:  02/23/14 189 lb (85.73 kg)  11/24/13 191 lb (86.637 kg)  08/25/13 198 lb (89.812 kg)   General Appearance: Well nourished, in no apparent distress. Eyes: PERRLA, EOMs, conjunctiva no swelling or erythema Sinuses: + Frontal/maxillary tenderness ENT/Mouth: Ext aud canals clear, TM left without erythema, bulging, right TM perforation. No erythema, swelling, or exudate on post pharynx.  Tonsils not swollen or erythematous. Wears bilateral hearing aids.  Neck: Supple, thyroid normal.  Respiratory: Respiratory effort normal, BS equal bilaterally without rales, rhonchi, wheezing or stridor.  Cardio: RRR with no MRGs. Brisk peripheral pulses without edema.  Abdomen: Soft, + BS.  Non tender, no guarding, rebound, hernias, masses. Lymphatics: Non tender without lymphadenopathy.  Musculoskeletal: Full ROM, 5/5 strength, normal gait.  Skin: Warm, dry without rashes, lesions, ecchymosis.  Neuro: Cranial nerves intact. Normal muscle tone, no cerebellar symptoms. Sensation intact.  Psych: Awake and oriented X 3, normal affect, Insight and Judgment appropriate.    Quentin Mullingollier, Yoshiko Keleher 8:50 AM

## 2014-03-30 ENCOUNTER — Other Ambulatory Visit: Payer: Self-pay | Admitting: Physician Assistant

## 2014-03-30 ENCOUNTER — Other Ambulatory Visit: Payer: Self-pay | Admitting: Internal Medicine

## 2014-03-30 MED ORDER — AMPHETAMINE-DEXTROAMPHETAMINE 20 MG PO TABS: 20.0000 mg | ORAL_TABLET | Freq: Two times a day (BID) | ORAL | Status: DC

## 2014-04-02 ENCOUNTER — Other Ambulatory Visit: Payer: Self-pay | Admitting: Physician Assistant

## 2014-04-23 ENCOUNTER — Emergency Department (HOSPITAL_BASED_OUTPATIENT_CLINIC_OR_DEPARTMENT_OTHER)
Admission: EM | Admit: 2014-04-23 | Discharge: 2014-04-23 | Disposition: A | Discharge status: Home / Self Care | Payer: Self-pay | Attending: Emergency Medicine | Admitting: Emergency Medicine

## 2014-04-23 ENCOUNTER — Encounter (HOSPITAL_BASED_OUTPATIENT_CLINIC_OR_DEPARTMENT_OTHER): Payer: Self-pay | Admitting: Emergency Medicine

## 2014-04-23 DIAGNOSIS — J209 Acute bronchitis, unspecified: Secondary | ICD-10-CM

## 2014-04-23 DIAGNOSIS — K219 Gastro-esophageal reflux disease without esophagitis: Secondary | ICD-10-CM | POA: Insufficient documentation

## 2014-04-23 DIAGNOSIS — F909 Attention-deficit hyperactivity disorder, unspecified type: Secondary | ICD-10-CM | POA: Insufficient documentation

## 2014-04-23 DIAGNOSIS — Z862 Personal history of diseases of the blood and blood-forming organs and certain disorders involving the immune mechanism: Secondary | ICD-10-CM | POA: Insufficient documentation

## 2014-04-23 DIAGNOSIS — I10 Essential (primary) hypertension: Secondary | ICD-10-CM | POA: Insufficient documentation

## 2014-04-23 DIAGNOSIS — Z720 Tobacco use: Secondary | ICD-10-CM | POA: Insufficient documentation

## 2014-04-23 DIAGNOSIS — Z79899 Other long term (current) drug therapy: Secondary | ICD-10-CM | POA: Insufficient documentation

## 2014-04-23 DIAGNOSIS — J45909 Unspecified asthma, uncomplicated: Secondary | ICD-10-CM | POA: Insufficient documentation

## 2014-04-23 DIAGNOSIS — F329 Major depressive disorder, single episode, unspecified: Secondary | ICD-10-CM | POA: Insufficient documentation

## 2014-04-23 MED ORDER — ALBUTEROL SULFATE HFA 108 (90 BASE) MCG/ACT IN AERS
2.0000 | INHALATION_SPRAY | RESPIRATORY_TRACT | Status: DC | PRN
  Administered 2014-04-23: 2 via RESPIRATORY_TRACT
  Filled 2014-04-23: qty 6.7

## 2014-04-23 MED ORDER — HYDROCOD POLST-CHLORPHEN POLST 10-8 MG/5ML PO LQCR: 5.0000 mL | Freq: Two times a day (BID) | ORAL | Status: DC | PRN

## 2014-04-23 NOTE — Discharge Instructions (Signed)

## 2014-04-23 NOTE — ED Notes (Signed)
Pt reports history of cough, upper airway congestion x 1 month, treated with antibiotics with no improvement, tonight states that she felt like she could not breath when lying flat

## 2014-04-23 NOTE — ED Provider Notes (Signed)
CSN: [id number]     Arrival date & time 04/23/14  0250 History   First MD Initiated Contact with Patient 04/23/14 0309     Chief Complaint  Patient presents with  . Cough     (Consider location/radiation/quality/duration/timing/severity/associated sxs/prior Treatment) HPI This is a 43 year old female with about a one-month history of a cough. The cough worsened yesterday and made it difficult for her to sleep at night. It was worse when lying supine and was associated with wheezing. She does not have an inhaler nor has she ever used one. She denies having a fever. The cough is described as occasionally productive of sputum. She is not having ear pain, throat pain, nasal congestion or chest pain.  Past Medical History  Diagnosis Date  . Asthma   . Depression   . GERD (gastroesophageal reflux disease)   . Anemia   . Hypertension   . ADD (attention deficit disorder)    History reviewed. No pertinent past surgical history. Family History  Problem Relation Age of Onset  . Cancer Mother     breast  . Diabetes Father   . Hypertension Father    History  Substance Use Topics  . Smoking status: Current Every Day Smoker -- 1.00 packs/day  . Smokeless tobacco: Not on file  . Alcohol Use: Yes   OB History   Grav Para Term Preterm Abortions TAB SAB Ect Mult Living                 Review of Systems  All other systems reviewed and are negative.   Allergies  Review of patient's allergies indicates no known allergies.  Home Medications   Prior to Admission medications   Medication Sig Start Date End Date Taking? Authorizing Provider  ALPRAZolam Prudy Feeler) 0.5 MG tablet Take 1 tablet (0.5 mg total) by mouth 2 (two) times daily as needed for anxiety or sleep. 02/23/14 02/23/15 Yes Quentin Mulling, PA-C  amphetamine-dextroamphetamine (ADDERALL) 20 MG tablet Take 1 tablet (20 mg total) by mouth 2 (two) times daily. 03/30/14 03/30/15 Yes Lucky Cowboy, MD  esomeprazole (NEXIUM) 20 MG capsule  Take 20 mg by mouth daily at 12 noon. OTC   Yes Historical Provider, MD  sertraline (ZOLOFT) 100 MG tablet TAKE TWO TABLETS BY MOUTH ONCE DAILY   Yes Quentin Mulling, PA-C  Antipyrine-Benzocaine (AURALGAN) 54-14 MG/ML SOLN Place 2 drops into the right ear every 2 (two) hours as needed for pain. 11/24/13   Quentin Mulling, PA-C  azithromycin (ZITHROMAX) 250 MG tablet 2 tablets by mouth today then one tablet daily for 4 days. 02/23/14   Quentin Mulling, PA-C  busPIRone (BUSPAR) 10 MG tablet Take 10 mg by mouth 3 (three) times daily.    Historical Provider, MD  citalopram (CELEXA) 40 MG tablet TAKE ONE TABLET BY MOUTH ONCE DAILY 04/02/14   Lucky Cowboy, MD   BP 128/79  Pulse 93  Temp(Src) 98.6 F (37 C) (Oral)  Resp 20  Ht 5\' 4"  (1.626 m)  Wt 180 lb (81.647 kg)  BMI 30.88 kg/m2  SpO2 98%  LMP 04/02/2014  Physical Exam General: Well-developed, well-nourished female in no acute distress; appearance consistent with age of record HENT: normocephalic; atraumatic; hearing aids; no nasal congestion; pharynx normal Eyes: pupils equal, round and reactive to light; extraocular muscles intact Neck: supple Heart: regular rate and rhythm gallops Lungs: clear to auscultation bilaterally with mildly decreased air movement  Abdomen: soft; nondistended; nontender; bowel sounds present Extremities: No deformity; full range of motion Neurologic: Awake,  alert and oriented; motor function intact in all extremities and symmetric; no facial droop Skin: Warm and dry Psychiatric: Normal mood and affect    ED Course  Procedures (including critical care time)  MDM  Patient advised to stop smoking. We will provide an albuterol inhaler and instruct her in its use.    Hanley SeamenJohn L Baneza Bartoszek, MD 04/23/14 [phone]

## 2014-04-27 ENCOUNTER — Other Ambulatory Visit: Payer: Self-pay | Admitting: Physician Assistant

## 2014-04-27 ENCOUNTER — Other Ambulatory Visit: Payer: Self-pay | Admitting: Internal Medicine

## 2014-04-27 MED ORDER — AMPHETAMINE-DEXTROAMPHETAMINE 20 MG PO TABS: 20.0000 mg | ORAL_TABLET | Freq: Two times a day (BID) | ORAL | Status: DC

## 2014-04-27 MED ORDER — ALPRAZOLAM 0.5 MG PO TABS: 0.5000 mg | ORAL_TABLET | Freq: Two times a day (BID) | ORAL | Status: DC | PRN

## 2014-05-10 ENCOUNTER — Emergency Department (HOSPITAL_BASED_OUTPATIENT_CLINIC_OR_DEPARTMENT_OTHER)
Admission: EM | Admit: 2014-05-10 | Discharge: 2014-05-10 | Disposition: A | Discharge status: Home / Self Care | Payer: Self-pay | Attending: Emergency Medicine | Admitting: Emergency Medicine

## 2014-05-10 ENCOUNTER — Encounter (HOSPITAL_BASED_OUTPATIENT_CLINIC_OR_DEPARTMENT_OTHER): Payer: Self-pay | Admitting: Emergency Medicine

## 2014-05-10 ENCOUNTER — Emergency Department (HOSPITAL_BASED_OUTPATIENT_CLINIC_OR_DEPARTMENT_OTHER): Payer: Self-pay

## 2014-05-10 DIAGNOSIS — I10 Essential (primary) hypertension: Secondary | ICD-10-CM | POA: Insufficient documentation

## 2014-05-10 DIAGNOSIS — F909 Attention-deficit hyperactivity disorder, unspecified type: Secondary | ICD-10-CM | POA: Insufficient documentation

## 2014-05-10 DIAGNOSIS — Z792 Long term (current) use of antibiotics: Secondary | ICD-10-CM | POA: Insufficient documentation

## 2014-05-10 DIAGNOSIS — K219 Gastro-esophageal reflux disease without esophagitis: Secondary | ICD-10-CM | POA: Insufficient documentation

## 2014-05-10 DIAGNOSIS — F329 Major depressive disorder, single episode, unspecified: Secondary | ICD-10-CM | POA: Insufficient documentation

## 2014-05-10 DIAGNOSIS — Z79899 Other long term (current) drug therapy: Secondary | ICD-10-CM | POA: Insufficient documentation

## 2014-05-10 DIAGNOSIS — J40 Bronchitis, not specified as acute or chronic: Secondary | ICD-10-CM

## 2014-05-10 DIAGNOSIS — R059 Cough, unspecified: Secondary | ICD-10-CM

## 2014-05-10 DIAGNOSIS — Z862 Personal history of diseases of the blood and blood-forming organs and certain disorders involving the immune mechanism: Secondary | ICD-10-CM | POA: Insufficient documentation

## 2014-05-10 DIAGNOSIS — J45901 Unspecified asthma with (acute) exacerbation: Secondary | ICD-10-CM | POA: Insufficient documentation

## 2014-05-10 DIAGNOSIS — R05 Cough: Secondary | ICD-10-CM

## 2014-05-10 DIAGNOSIS — Z720 Tobacco use: Secondary | ICD-10-CM | POA: Insufficient documentation

## 2014-05-10 DIAGNOSIS — Z880 Allergy status to penicillin: Secondary | ICD-10-CM | POA: Insufficient documentation

## 2014-05-10 MED ORDER — AZITHROMYCIN 250 MG PO TABS: 250.0000 mg | ORAL_TABLET | Freq: Every day | ORAL | Status: DC

## 2014-05-10 MED ORDER — BENZONATATE 100 MG PO CAPS: 100.0000 mg | ORAL_CAPSULE | Freq: Three times a day (TID) | ORAL | Status: DC

## 2014-05-10 MED ORDER — PREDNISONE 20 MG PO TABS: ORAL_TABLET | ORAL | Status: DC

## 2014-05-10 NOTE — ED Notes (Signed)
Reports feeling bad since August- cough and congestion

## 2014-05-10 NOTE — Discharge Instructions (Signed)
Upper Respiratory Infection, Adult An upper respiratory infection (URI) is also sometimes known as the common cold. The upper respiratory tract includes the nose, sinuses, throat, trachea, and bronchi. Bronchi are the airways leading to the lungs. Most people improve within 1 week, but symptoms can last up to 2 weeks. A residual cough may last even longer.  CAUSES Many different viruses can infect the tissues lining the upper respiratory tract. The tissues become irritated and inflamed and often become very moist. Mucus production is also common. A cold is contagious. You can easily spread the virus to others by oral contact. This includes kissing, sharing a glass, coughing, or sneezing. Touching your mouth or nose and then touching a surface, which is then touched by another person, can also spread the virus. SYMPTOMS  Symptoms typically develop 1 to 3 days after you come in contact with a cold virus. Symptoms vary from person to person. They may include:  Runny nose.  Sneezing.  Nasal congestion.  Sinus irritation.  Sore throat.  Loss of voice (laryngitis).  Cough.  Fatigue.  Muscle aches.  Loss of appetite.  Headache.  Low-grade fever. DIAGNOSIS  You might diagnose your own cold based on familiar symptoms, since most people get a cold 2 to 3 times a year. Your caregiver can confirm this based on your exam. Most importantly, your caregiver can check that your symptoms are not due to another disease such as strep throat, sinusitis, pneumonia, asthma, or epiglottitis. Blood tests, throat tests, and X-rays are not necessary to diagnose a common cold, but they may sometimes be helpful in excluding other more serious diseases. Your caregiver will decide if any further tests are required. RISKS AND COMPLICATIONS  You may be at risk for a more severe case of the common cold if you smoke cigarettes, have chronic heart disease (such as heart failure) or lung disease (such as asthma), or if  you have a weakened immune system. The very young and very old are also at risk for more serious infections. Bacterial sinusitis, middle ear infections, and bacterial pneumonia can complicate the common cold. The common cold can worsen asthma and chronic obstructive pulmonary disease (COPD). Sometimes, these complications can require emergency medical care and may be life-threatening. PREVENTION  The best way to protect against getting a cold is to practice good hygiene. Avoid oral or hand contact with people with cold symptoms. Wash your hands often if contact occurs. There is no clear evidence that vitamin C, vitamin E, echinacea, or exercise reduces the chance of developing a cold. However, it is always recommended to get plenty of rest and practice good nutrition. TREATMENT  Treatment is directed at relieving symptoms. There is no cure. Antibiotics are not effective, because the infection is caused by a virus, not by bacteria. Treatment may include:  Increased fluid intake. Sports drinks offer valuable electrolytes, sugars, and fluids.  Breathing heated mist or steam (vaporizer or shower).  Eating chicken soup or other clear broths, and maintaining good nutrition.  Getting plenty of rest.  Using gargles or lozenges for comfort.  Controlling fevers with ibuprofen or acetaminophen as directed by your caregiver.  Increasing usage of your inhaler if you have asthma. Zinc gel and zinc lozenges, taken in the first 24 hours of the common cold, can shorten the duration and lessen the severity of symptoms. Pain medicines may help with fever, muscle aches, and throat pain. A variety of non-prescription medicines are available to treat congestion and runny nose. Your caregiver   can make recommendations and may suggest nasal or lung inhalers for other symptoms.  HOME CARE INSTRUCTIONS   Only take over-the-counter or prescription medicines for pain, discomfort, or fever as directed by your  caregiver.  Use a warm mist humidifier or inhale steam from a shower to increase air moisture. This may keep secretions moist and make it easier to breathe.  Drink enough water and fluids to keep your urine clear or pale yellow.  Rest as needed.  Return to work when your temperature has returned to normal or as your caregiver advises. You may need to stay home longer to avoid infecting others. You can also use a face mask and careful hand washing to prevent spread of the virus. SEEK MEDICAL CARE IF:   After the first few days, you feel you are getting worse rather than better.  You need your caregiver's advice about medicines to control symptoms.  You develop chills, worsening shortness of breath, or brown or red sputum. These may be signs of pneumonia.  You develop yellow or brown nasal discharge or pain in the face, especially when you bend forward. These may be signs of sinusitis.  You develop a fever, swollen neck glands, pain with swallowing, or white areas in the back of your throat. These may be signs of strep throat. SEEK IMMEDIATE MEDICAL CARE IF:   You have a fever.  You develop severe or persistent headache, ear pain, sinus pain, or chest pain.  You develop wheezing, a prolonged cough, cough up blood, or have a change in your usual mucus (if you have chronic lung disease).  You develop sore muscles or a stiff neck. Document Released: 12/27/2000 Document Revised: 09/25/2011 Document Reviewed: 10/08/2013 Exit[employer] Patient Information 2015 [employer]. This information is not intended to replace advice given to you by your health care provider. Make sure you discuss any questions you have with your health care provider.  

## 2014-05-10 NOTE — ED Notes (Signed)
D/c home- Rx x 3 given for prednisone, tessalon, and zithromax

## 2014-05-10 NOTE — ED Notes (Signed)
Reports she has been sick with respiratory illness since August- current sx x  7 weeks- reports she smokes but is trying to quit and also has been feeling overwhelmed with stressors at job and home. Denies HI/SI/abuse. Pt given community programs brochure with counseling resources phone numbers highlighted

## 2014-05-10 NOTE — ED Notes (Signed)
MD at bedside. 

## 2014-05-10 NOTE — ED Provider Notes (Signed)
CSN: [id number]     Arrival date & time 05/10/14  1324 History   First MD Initiated Contact with Patient 05/10/14 [phone]     Chief Complaint  Patient presents with  . Cough     (Consider location/radiation/quality/duration/timing/severity/associated sxs/prior Treatment) HPI Comments: Patient presents with a cough. She states she's had a cold symptoms off and on since August but she's had worsening cough over the last 2 weeks. She states her cough is productive sputum. She has some intermittent shortness of breath on ambulation. She feels congested in her chest. She also has some clear rhinorrhea. She denies any fevers. She denies any nausea vomiting or diarrhea. She was seen here 2 weeks ago for the same symptoms. She was given an albuterol inhaler which she's been using but has no improvement of symptoms.  Patient is a 43 y.o. female presenting with cough.  Cough Associated symptoms: rhinorrhea and shortness of breath   Associated symptoms: no chest pain, no chills, no diaphoresis, no fever, no headaches and no rash     Past Medical History  Diagnosis Date  . Asthma   . Depression   . GERD (gastroesophageal reflux disease)   . Anemia   . Hypertension   . ADD (attention deficit disorder)    Past Surgical History  Procedure Laterality Date  . External ear surgery    . Inner ear surgery    . Middle ear surgery    . Bladder surgery    . Cesarean section    . Mastoid surgery     Family History  Problem Relation Age of Onset  . Cancer Mother     breast  . Diabetes Father   . Hypertension Father    History  Substance Use Topics  . Smoking status: Current Every Day Smoker -- 1.00 packs/day  . Smokeless tobacco: Never Used  . Alcohol Use: Yes   OB History   Grav Para Term Preterm Abortions TAB SAB Ect Mult Living                 Review of Systems  Constitutional: Negative for fever, chills, diaphoresis and fatigue.  HENT: Positive for congestion, postnasal drip and  rhinorrhea. Negative for sneezing.   Eyes: Negative.   Respiratory: Positive for cough and shortness of breath. Negative for chest tightness.   Cardiovascular: Negative for chest pain and leg swelling.  Gastrointestinal: Negative for nausea, vomiting, abdominal pain, diarrhea and blood in stool.  Genitourinary: Negative for frequency, hematuria, flank pain and difficulty urinating.  Musculoskeletal: Negative for arthralgias and back pain.  Skin: Negative for rash.  Neurological: Negative for dizziness, speech difficulty, weakness, numbness and headaches.      Allergies  Penicillins  Home Medications   Prior to Admission medications   Medication Sig Start Date End Date Taking? Authorizing Provider  ALPRAZolam Prudy Feeler) 0.5 MG tablet Take 1 tablet (0.5 mg total) by mouth 2 (two) times daily as needed for anxiety or sleep. 04/27/14 04/27/15 Yes Quentin Mulling, PA-C  amphetamine-dextroamphetamine (ADDERALL) 20 MG tablet Take 1 tablet (20 mg total) by mouth 2 (two) times daily. 04/27/14 04/27/15 Yes Quentin Mulling, PA-C  busPIRone (BUSPAR) 10 MG tablet Take 10 mg by mouth 3 (three) times daily.   Yes Historical Provider, MD  esomeprazole (NEXIUM) 20 MG capsule Take 20 mg by mouth daily at 12 noon. OTC   Yes Historical Provider, MD  Antipyrine-Benzocaine Lyla Son) 54-14 MG/ML SOLN Place 2 drops into the right ear every 2 (two) hours as needed  for pain. 11/24/13   Quentin MullingAmanda Collier, PA-C  azithromycin (ZITHROMAX) 250 MG tablet 2 tablets by mouth today then one tablet daily for 4 days. 02/23/14   Quentin MullingAmanda Collier, PA-C  azithromycin (ZITHROMAX) 250 MG tablet Take 1 tablet (250 mg total) by mouth daily. Take first 2 tablets together, then 1 every day until finished. 05/10/14   Rolan BuccoMelanie Payam Gribble, MD  benzonatate (TESSALON) 100 MG capsule Take 1 capsule (100 mg total) by mouth every 8 (eight) hours. 05/10/14   Rolan BuccoMelanie Amarachukwu Lakatos, MD  chlorpheniramine-HYDROcodone (TUSSIONEX PENNKINETIC ER) 10-8 MG/5ML LQCR Take 5 mLs  by mouth every 12 (twelve) hours as needed for cough. 04/23/14   Carlisle BeersJohn L Molpus, MD  citalopram (CELEXA) 40 MG tablet TAKE ONE TABLET BY MOUTH ONCE DAILY 04/02/14   Lucky CowboyWilliam McKeown, MD  predniSONE (DELTASONE) 20 MG tablet 3 tabs po day one, then 2 po daily x 4 days 05/10/14   Rolan BuccoMelanie Lux Meaders, MD  sertraline (ZOLOFT) 100 MG tablet TAKE TWO TABLETS BY MOUTH ONCE DAILY    Quentin MullingAmanda Collier, PA-C   BP 141/91  Pulse 91  Temp(Src) 98 F (36.7 C) (Oral)  Resp 18  Ht 5' 4.5" (1.638 m)  Wt 187 lb (84.823 kg)  BMI 31.61 kg/m2  SpO2 100%  LMP 05/03/2014 Physical Exam  Constitutional: She is oriented to person, place, and time. She appears well-developed and well-nourished.  HENT:  Head: Normocephalic and atraumatic.  Right Ear: External ear normal.  Left Ear: External ear normal.  Mouth/Throat: Oropharynx is clear and moist.  Patient has scarring to the TMs, she wears hearing aids. I don't see any signs of infection.  Eyes: Pupils are equal, round, and reactive to light.  Neck: Normal range of motion. Neck supple.  Cardiovascular: Normal rate, regular rhythm and normal heart sounds.   Pulmonary/Chest: Effort normal and breath sounds normal. No respiratory distress. She has no wheezes. She has no rales. She exhibits no tenderness.  Abdominal: Soft. Bowel sounds are normal. There is no tenderness. There is no rebound and no guarding.  Musculoskeletal: Normal range of motion. She exhibits no edema.  Lymphadenopathy:    She has no cervical adenopathy.  Neurological: She is alert and oriented to person, place, and time.  Skin: Skin is warm and dry. No rash noted.  Psychiatric: She has a normal mood and affect.    ED Course  Procedures (including critical care time) Labs Review Labs Reviewed - No data to display  Imaging Review Dg Chest 2 View  05/10/2014   CLINICAL DATA:  Acute cough and congestion, history of asthma  EXAM: CHEST  2 VIEW  COMPARISON:  None.  FINDINGS: The heart size and  mediastinal contours are within normal limits. Both lungs are clear. The visualized skeletal structures are unremarkable.  IMPRESSION: No active cardiopulmonary disease.   Electronically Signed   By: Ruel Favorsrevor  Shick M.D.   On: 05/10/2014 10:22     EKG Interpretation None      MDM   Final diagnoses:  Cough  Bronchitis    Patient presents with cough and chest congestion. The symptoms have been going on for more than 2 weeks. At this point I will go ahead and start her on a course of antibiotics. She was given a prescription for Zithromax as well as prednisone and Tessalon Perles. She can continue using albuterol inhaler. There is no evidence of pneumonia on chest x-ray. She was encouraged to follow-up with her primary care physician if her symptoms are not improving. She recently  has stopped her Zoloft and Celexa. She seems like she is under a lot of stress and has some depression but she denies any suicidal or homicidal ideations. I encouraged her to consult her primary care physician regarding ongoing management. She was also given outpatient resources for counseling.    Rolan BuccoMelanie Mikhala Kenan, MD 05/10/14 [phone]

## 2014-06-01 ENCOUNTER — Telehealth: Payer: Self-pay | Admitting: Physician Assistant

## 2014-06-01 MED ORDER — AMPHETAMINE-DEXTROAMPHETAMINE 20 MG PO TABS: 20.0000 mg | ORAL_TABLET | Freq: Two times a day (BID) | ORAL | Status: DC

## 2014-06-01 NOTE — Telephone Encounter (Signed)
Patient needs refill of Adderall.  Patient follows up regularly and has appt with Quentin MullingAmanda Collier, PA-C on 08/31/14.  Last refilled 04/27/14.    Xavia Kniskern, Lise AuerJennifer L, PA-C 10:50 PM [facility]

## 2014-06-02 ENCOUNTER — Other Ambulatory Visit: Payer: Self-pay

## 2014-06-02 MED ORDER — AMPHETAMINE-DEXTROAMPHETAMINE 20 MG PO TABS: 20.0000 mg | ORAL_TABLET | Freq: Two times a day (BID) | ORAL | Status: DC

## 2014-06-02 NOTE — Telephone Encounter (Signed)
Patients RX printed and up front desk for pick up, [facility]

## 2014-06-02 NOTE — Telephone Encounter (Signed)
Re-ordered RX to print , advised Victorino DikeJennifer cannot call in Adderall , patient called to advise ready at front desk

## 2014-06-04 ENCOUNTER — Other Ambulatory Visit: Payer: Self-pay | Admitting: Physician Assistant

## 2014-06-04 MED ORDER — AMPHETAMINE-DEXTROAMPHETAMINE 20 MG PO TABS: 20.0000 mg | ORAL_TABLET | Freq: Two times a day (BID) | ORAL | Status: DC

## 2014-06-17 ENCOUNTER — Other Ambulatory Visit: Payer: Self-pay | Admitting: Physician Assistant

## 2014-06-18 ENCOUNTER — Other Ambulatory Visit: Payer: Self-pay | Admitting: Physician Assistant

## 2014-06-18 MED ORDER — ALPRAZOLAM 0.5 MG PO TABS: 0.5000 mg | ORAL_TABLET | Freq: Two times a day (BID) | ORAL | Status: DC | PRN

## 2014-06-29 ENCOUNTER — Other Ambulatory Visit: Payer: Self-pay | Admitting: Physician Assistant

## 2014-06-30 MED ORDER — AMPHETAMINE-DEXTROAMPHETAMINE 20 MG PO TABS: 20.0000 mg | ORAL_TABLET | Freq: Two times a day (BID) | ORAL | Status: DC

## 2014-07-20 ENCOUNTER — Encounter: Payer: Self-pay | Admitting: Physician Assistant

## 2014-07-23 ENCOUNTER — Other Ambulatory Visit: Payer: Self-pay | Admitting: Internal Medicine

## 2014-07-27 ENCOUNTER — Encounter: Payer: Self-pay | Admitting: Physician Assistant

## 2014-07-27 ENCOUNTER — Other Ambulatory Visit: Payer: Self-pay | Admitting: Physician Assistant

## 2014-07-27 MED ORDER — ALPRAZOLAM 0.5 MG PO TABS: 0.5000 mg | ORAL_TABLET | Freq: Two times a day (BID) | ORAL | Status: DC | PRN

## 2014-08-04 ENCOUNTER — Other Ambulatory Visit: Payer: Self-pay | Admitting: Physician Assistant

## 2014-08-04 MED ORDER — AMPHETAMINE-DEXTROAMPHETAMINE 20 MG PO TABS: 20.0000 mg | ORAL_TABLET | Freq: Two times a day (BID) | ORAL | Status: DC

## 2014-08-24 ENCOUNTER — Telehealth: Payer: Self-pay | Admitting: Physician Assistant

## 2014-08-24 MED ORDER — PREDNISONE 20 MG PO TABS: ORAL_TABLET | ORAL | Status: DC

## 2014-08-24 MED ORDER — AZITHROMYCIN 250 MG PO TABS: ORAL_TABLET | ORAL | Status: DC

## 2014-08-24 MED ORDER — BENZONATATE 100 MG PO CAPS: 100.0000 mg | ORAL_CAPSULE | Freq: Four times a day (QID) | ORAL | Status: DC | PRN

## 2014-08-24 NOTE — Telephone Encounter (Signed)
EVISIT TELEPHONE Patient called the office for a telephone visit complaining of possible sinusitis. Symptoms include nasal congestion, no  fever, productive cough with  green colored sputum, sinus pressure, sore throat and headache. Onset of symptoms was 10 days ago, and has been gradually worsening since that time. Treatment to date: tylenol sinus q 4 hours.  PLAN: URI- Discussed the diagnosis and treatment of sinusitis. Suggested symptomatic OTC remedies. Suggested brief course of Afrin or similar. Nasal saline spray for congestion. Zithromax per orders. Follow up as needed. Call in 7 days if symptoms aren't resolving.

## 2014-08-27 ENCOUNTER — Encounter: Payer: Self-pay | Admitting: Internal Medicine

## 2014-08-31 ENCOUNTER — Ambulatory Visit: Payer: Self-pay | Admitting: Physician Assistant

## 2014-09-10 ENCOUNTER — Other Ambulatory Visit: Payer: Self-pay | Admitting: Physician Assistant

## 2014-09-10 MED ORDER — AMPHETAMINE-DEXTROAMPHETAMINE 20 MG PO TABS: 20.0000 mg | ORAL_TABLET | Freq: Two times a day (BID) | ORAL | Status: DC

## 2014-09-17 ENCOUNTER — Emergency Department (HOSPITAL_BASED_OUTPATIENT_CLINIC_OR_DEPARTMENT_OTHER)
Admission: EM | Admit: 2014-09-17 | Discharge: 2014-09-17 | Disposition: A | Discharge status: Home / Self Care | Payer: Self-pay | Attending: Emergency Medicine | Admitting: Emergency Medicine

## 2014-09-17 ENCOUNTER — Encounter (HOSPITAL_BASED_OUTPATIENT_CLINIC_OR_DEPARTMENT_OTHER): Payer: Self-pay

## 2014-09-17 DIAGNOSIS — Z720 Tobacco use: Secondary | ICD-10-CM | POA: Insufficient documentation

## 2014-09-17 DIAGNOSIS — Z862 Personal history of diseases of the blood and blood-forming organs and certain disorders involving the immune mechanism: Secondary | ICD-10-CM | POA: Insufficient documentation

## 2014-09-17 DIAGNOSIS — Z3202 Encounter for pregnancy test, result negative: Secondary | ICD-10-CM | POA: Insufficient documentation

## 2014-09-17 DIAGNOSIS — B349 Viral infection, unspecified: Secondary | ICD-10-CM | POA: Insufficient documentation

## 2014-09-17 DIAGNOSIS — Z792 Long term (current) use of antibiotics: Secondary | ICD-10-CM | POA: Insufficient documentation

## 2014-09-17 DIAGNOSIS — K219 Gastro-esophageal reflux disease without esophagitis: Secondary | ICD-10-CM | POA: Insufficient documentation

## 2014-09-17 DIAGNOSIS — J45909 Unspecified asthma, uncomplicated: Secondary | ICD-10-CM | POA: Insufficient documentation

## 2014-09-17 DIAGNOSIS — Z7952 Long term (current) use of systemic steroids: Secondary | ICD-10-CM | POA: Insufficient documentation

## 2014-09-17 DIAGNOSIS — M791 Myalgia, unspecified site: Secondary | ICD-10-CM

## 2014-09-17 DIAGNOSIS — R197 Diarrhea, unspecified: Secondary | ICD-10-CM

## 2014-09-17 DIAGNOSIS — Z880 Allergy status to penicillin: Secondary | ICD-10-CM | POA: Insufficient documentation

## 2014-09-17 DIAGNOSIS — F329 Major depressive disorder, single episode, unspecified: Secondary | ICD-10-CM | POA: Insufficient documentation

## 2014-09-17 LAB — URINALYSIS, ROUTINE W REFLEX MICROSCOPIC
BILIRUBIN URINE: NEGATIVE
GLUCOSE, UA: NEGATIVE mg/dL
HGB URINE DIPSTICK: NEGATIVE
KETONES UR: 15 mg/dL — AB
Leukocytes, UA: NEGATIVE
Nitrite: NEGATIVE
PROTEIN: NEGATIVE mg/dL
Specific Gravity, Urine: 1.016 (ref 1.005–1.030)
Urobilinogen, UA: 0.2 mg/dL (ref 0.0–1.0)
pH: 6 (ref 5.0–8.0)

## 2014-09-17 LAB — BASIC METABOLIC PANEL
Anion gap: 2 — ABNORMAL LOW (ref 5–15)
BUN: 6 mg/dL (ref 6–23)
CO2: 24 mmol/L (ref 19–32)
CREATININE: 0.4 mg/dL — AB (ref 0.50–1.10)
Calcium: 8 mg/dL — ABNORMAL LOW (ref 8.4–10.5)
Chloride: 104 mmol/L (ref 96–112)
GFR calc Af Amer: >90 mL/min (ref >90)
GFR calc non Af Amer: >90 mL/min (ref >90)
Glucose, Bld: 99 mg/dL (ref 70–99)
POTASSIUM: 3.3 mmol/L — AB (ref 3.5–5.1)
Sodium: 130 mmol/L — ABNORMAL LOW (ref 135–145)

## 2014-09-17 LAB — PREGNANCY, URINE: Preg Test, Ur: NEGATIVE

## 2014-09-17 MED ORDER — SODIUM CHLORIDE 0.9 % IV BOLUS (SEPSIS)
1000.0000 mL | Freq: Once | INTRAVENOUS | Status: AC
  Administered 2014-09-17: 1000 mL via INTRAVENOUS

## 2014-09-17 MED ORDER — IBUPROFEN 800 MG PO TABS: 800.0000 mg | ORAL_TABLET | Freq: Three times a day (TID) | ORAL | Status: DC | PRN

## 2014-09-17 MED ORDER — ONDANSETRON HCL 4 MG/2ML IJ SOLN
4.0000 mg | Freq: Once | INTRAMUSCULAR | Status: AC
  Administered 2014-09-17: 4 mg via INTRAVENOUS
  Filled 2014-09-17: qty 2

## 2014-09-17 MED ORDER — KETOROLAC TROMETHAMINE 30 MG/ML IJ SOLN
30.0000 mg | Freq: Once | INTRAMUSCULAR | Status: AC
  Administered 2014-09-17: 30 mg via INTRAVENOUS
  Filled 2014-09-17: qty 1

## 2014-09-17 MED ORDER — ONDANSETRON HCL 4 MG PO TABS: 4.0000 mg | ORAL_TABLET | Freq: Three times a day (TID) | ORAL | Status: DC | PRN

## 2014-09-17 MED ORDER — POTASSIUM CHLORIDE CRYS ER 20 MEQ PO TBCR
40.0000 meq | EXTENDED_RELEASE_TABLET | Freq: Once | ORAL | Status: AC
  Administered 2014-09-17: 40 meq via ORAL
  Filled 2014-09-17: qty 2

## 2014-09-17 MED ORDER — LOPERAMIDE HCL 2 MG PO CAPS: 2.0000 mg | ORAL_CAPSULE | Freq: Four times a day (QID) | ORAL | Status: DC | PRN

## 2014-09-17 NOTE — Discharge Instructions (Signed)
Read the information below.  Use the prescribed medication as directed.  Please discuss all new medications with your pharmacist.  You may return to the Emergency Department at any time for worsening condition or any new symptoms that concern you.   If you develop high fevers, worsening abdominal pain, uncontrolled vomiting, or are unable to tolerate fluids by mouth, return to the ER for a recheck.  ° °

## 2014-09-17 NOTE — ED Notes (Signed)
Pt states generalized body aches, upset stomach and chills since yesterday.  Pt states "fever" at home of "99.36F".  Pt states diarrhea as well.  Able to keep water down, but no food per patient.

## 2014-09-17 NOTE — ED Notes (Signed)
Pt reports cough, fever, chills, bodyaches since yesterday. Reports subjective fever at home.

## 2014-09-17 NOTE — ED Notes (Signed)
Patient states she is unable to void at this time, aware of pending urine sample.

## 2014-09-17 NOTE — ED Provider Notes (Signed)
CSN: [id number]     Arrival date & time 09/17/14  1313 History   First MD Initiated Contact with Patient 09/17/14 1530     Chief Complaint  Patient presents with  . Generalized Body Aches     (Consider location/radiation/quality/duration/timing/severity/associated sxs/prior Treatment) The history is provided by the patient.     Pt p/w myalgias, chills, headache, productive cough, anorexia, diarrhea, decreased urinary output that began yesterday.  The worst part for her is the myalgias.  She has taken no medications for her symptoms.  Denies CP, SOB.  LMP 3 weeks ago.   Past Medical History  Diagnosis Date  . Asthma   . Depression   . GERD (gastroesophageal reflux disease)   . Anemia   . ADD (attention deficit disorder)    Past Surgical History  Procedure Laterality Date  . External ear surgery    . Inner ear surgery    . Middle ear surgery    . Bladder surgery    . Cesarean section    . Mastoid surgery     Family History  Problem Relation Age of Onset  . Cancer Mother     breast  . Diabetes Father   . Hypertension Father    History  Substance Use Topics  . Smoking status: Current Every Day Smoker -- 1.00 packs/day  . Smokeless tobacco: Never Used  . Alcohol Use: Yes   OB History    No data available     Review of Systems  All other systems reviewed and are negative.     Allergies  Penicillins  Home Medications   Prior to Admission medications   Medication Sig Start Date End Date Taking? Authorizing Provider  ALPRAZolam Prudy Feeler(XANAX) 0.5 MG tablet Take 1 tablet (0.5 mg total) by mouth 2 (two) times daily as needed for anxiety or sleep. 07/27/14 07/27/15  Quentin MullingAmanda Collier, PA-C  amphetamine-dextroamphetamine (ADDERALL) 20 MG tablet Take 1 tablet (20 mg total) by mouth 2 (two) times daily. MDD: 2 tablets per day. 09/10/14 09/10/15  Quentin MullingAmanda Collier, PA-C  Antipyrine-Benzocaine Lyla Son(AURALGAN) [phone] MG/ML SOLN Place 2 drops into the right ear every 2 (two) hours as needed for  pain. 11/24/13   Quentin MullingAmanda Collier, PA-C  azithromycin (ZITHROMAX) 250 MG tablet 2 tablets by mouth today then one tablet daily for 4 days. 08/24/14   Quentin MullingAmanda Collier, PA-C  benzonatate (TESSALON PERLES) 100 MG capsule Take 1 capsule (100 mg total) by mouth every 6 (six) hours as needed for cough. 08/24/14   Quentin MullingAmanda Collier, PA-C  busPIRone (BUSPAR) 10 MG tablet Take 10 mg by mouth 3 (three) times daily.    Historical Provider, MD  citalopram (CELEXA) 40 MG tablet TAKE ONE TABLET BY MOUTH ONCE DAILY 07/23/14   Lucky CowboyWilliam McKeown, MD  esomeprazole (NEXIUM) 20 MG capsule Take 20 mg by mouth daily at 12 noon. OTC    Historical Provider, MD  predniSONE (DELTASONE) 20 MG tablet 2 tablets daily for 3 days, 1 tablet daily for 4 days. 08/24/14   Quentin MullingAmanda Collier, PA-C  sertraline (ZOLOFT) 100 MG tablet TAKE TWO TABLETS BY MOUTH ONCE DAILY    Quentin MullingAmanda Collier, PA-C   BP 155/81 mmHg  Pulse 91  Temp(Src) 98.3 F (36.8 C) (Oral)  Resp 18  Ht 5' 4.5" (1.638 m)  Wt 171 lb (77.565 kg)  BMI 28.91 kg/m2  SpO2 99%  LMP 08/27/2014 Physical Exam  Constitutional: She appears well-developed and well-nourished. No distress.  HENT:  Head: Normocephalic and atraumatic.  Neck: Neck supple.  Cardiovascular: Normal rate and regular rhythm.   Pulmonary/Chest: Effort normal and breath sounds normal. No respiratory distress. She has no wheezes. She has no rales.  Abdominal: Soft. She exhibits no distension. There is generalized tenderness. There is no rebound and no guarding.  Neurological: She is alert.  Skin: She is not diaphoretic.  Nursing note and vitals reviewed.   ED Course  Procedures (including critical care time) Labs Review Labs Reviewed  URINALYSIS, ROUTINE W REFLEX MICROSCOPIC - Abnormal; Notable for the following:    Ketones, ur 15 (*)    All other components within normal limits  BASIC METABOLIC PANEL - Abnormal; Notable for the following:    Sodium 130 (*)    Potassium 3.3 (*)    Creatinine, Ser 0.40 (*)     Calcium 8.0 (*)    Anion gap 2 (*)    All other components within normal limits  PREGNANCY, URINE    Imaging Review No results found.   EKG Interpretation None       5:39 PM  Pt reports she is feeling much better.    MDM   Final diagnoses:  Myalgia  Diarrhea  Viral illness    Afebrile, nontoxic patient with constellation of symptoms suggestive of viral syndrome.  No concerning findings on exam.  K slightly low at 3.3, pt given IVF and PO potassium, toradol, zofran.  Labs, UA otherwise unremarkable.  Discharged home with supportive care, PCP follow up.  Discussed result, findings, treatment, and follow up  with patient.  Pt given return precautions.  Pt verbalizes understanding and agrees with plan.        [place], PA-C 09/17/14 1610  Pricilla Loveless, MD 09/22/14 [phone]

## 2014-09-17 NOTE — ED Notes (Signed)
Pt states unable to void at this time for urine sample.

## 2014-09-30 ENCOUNTER — Encounter: Payer: Self-pay | Admitting: Physician Assistant

## 2014-09-30 ENCOUNTER — Ambulatory Visit (INDEPENDENT_AMBULATORY_CARE_PROVIDER_SITE_OTHER): Payer: Self-pay | Admitting: Physician Assistant

## 2014-09-30 VITALS — BP 110/70 | HR 88 | Temp 98.2°F | Resp 16 | Ht 64.5 in | Wt 173.0 lb

## 2014-09-30 DIAGNOSIS — K219 Gastro-esophageal reflux disease without esophagitis: Secondary | ICD-10-CM | POA: Insufficient documentation

## 2014-09-30 DIAGNOSIS — F988 Other specified behavioral and emotional disorders with onset usually occurring in childhood and adolescence: Secondary | ICD-10-CM

## 2014-09-30 DIAGNOSIS — F325 Major depressive disorder, single episode, in full remission: Secondary | ICD-10-CM | POA: Insufficient documentation

## 2014-09-30 DIAGNOSIS — Z720 Tobacco use: Secondary | ICD-10-CM

## 2014-09-30 DIAGNOSIS — F329 Major depressive disorder, single episode, unspecified: Secondary | ICD-10-CM | POA: Insufficient documentation

## 2014-09-30 DIAGNOSIS — F909 Attention-deficit hyperactivity disorder, unspecified type: Secondary | ICD-10-CM

## 2014-09-30 DIAGNOSIS — F17200 Nicotine dependence, unspecified, uncomplicated: Secondary | ICD-10-CM

## 2014-09-30 DIAGNOSIS — J45909 Unspecified asthma, uncomplicated: Secondary | ICD-10-CM | POA: Insufficient documentation

## 2014-09-30 MED ORDER — ALPRAZOLAM 0.5 MG PO TABS: 0.5000 mg | ORAL_TABLET | Freq: Two times a day (BID) | ORAL | Status: DC | PRN

## 2014-09-30 MED ORDER — CITALOPRAM HYDROBROMIDE 40 MG PO TABS: 40.0000 mg | ORAL_TABLET | Freq: Every day | ORAL | Status: DC

## 2014-09-30 MED ORDER — AMPHETAMINE-DEXTROAMPHETAMINE 20 MG PO TABS: 20.0000 mg | ORAL_TABLET | Freq: Two times a day (BID) | ORAL | Status: DC

## 2014-09-30 NOTE — Patient Instructions (Addendum)
Seborrheic Keratosis Seborrheic keratosis is a common, noncancerous (benign) skin growth that can occur anywhere on the skin.It looks like "stuck-on," waxy, rough, tan, brown, or black spots on the skin. These skin growths can be flat or raised.They are often called "barnacles" because of their pasted-on appearance.Usually, these skin growths appear in adulthood, around age 44, and increase in number as you age. They may also develop during pregnancy or following estrogen therapy. Many people may only have one growth appear in their lifetime, while some people may develop many growths. CAUSES It is unknown what causes these skin growths, but they appear to run in families. SYMPTOMS Seborrheic keratosis is often located on the face, chest, shoulders, back, or other areas. These growths are:  Usually painless, but may become irritated and itchy.  Yellow, brown, black, or other colors.  Slightly raised or have a flat surface.  Sometimes rough or wart-like in texture.  Often waxy on the surface.  Round or oval-shaped.  Sometimes "stuck-on" in appearance.  Sometimes single, but there are usually many growths. Any growth that bleeds, itches on a regular basis, becomes inflamed, or becomes irritated needs to be evaluated by a skin specialist (dermatologist). DIAGNOSIS Diagnosis is mainly based on the way the growths appear. In some cases, it can be difficult to tell this type of skin growth from skin cancer. A skin growth tissue sample (biopsy) may be used to confirm the diagnosis. TREATMENT Most often, treatment is not needed because the skin growths are benign.If the skin growth is irritated easily by clothing or jewelry, causing it to scab or bleed, treatment may be recommended. Patients may also choose to have the growths removed because they do not like their appearance. Most commonly, these growths are treated with cryosurgery. In cryosurgery, liquid nitrogen is applied to "freeze" the  growth. The growth usually falls off within a matter of days. A blister may form and dry into a scab that will also fall off. After the growth or scab falls off, it may leave a dark or light spot on the skin. This color may fade over time, or it may remain permanent on the skin. HOME CARE INSTRUCTIONS If the skin growths are treated with cryosurgery, the treated area needs to be kept clean with water and soap. SEEK MEDICAL CARE IF:  You have questions about these growths or other skin problems.  You develop new symptoms, including:  A change in the appearance of the skin growth.  New growths.  Any bleeding, itching, or pain in the growths.  A skin growth that looks similar to seborrheic keratosis. Document Released: 08/05/2010 Document Revised: 09/25/2011 Document Reviewed: 08/05/2010 [facility] Patient Information 2015 [place], [place]. This information is not intended to replace advice given to you by your health care provider. Make sure you discuss any questions you have with your health care provider.  Nexium/protonix/prilosec are called PPI's, they are great at healing your stomach but should only be taken for a short period of time.   Studies are showing that taken for a long time it can increase the risk of osteoporosis (weakening of your bones), pneumonia, low magnesium, restless legs, Cdiff (infection that causes diarrhea), and most recently kidney disease/insufficiency.  Due to this information we want to try to stop the PPI but if you try to stop it abruptly this can cause rebound acid and worsening symptoms.   So this is how we want you to get off the PPI: - Start taking the nexium/protonix/prilosec or which every  PPI you are on every other day for 2 week while starting to take pepcid or zantac (generic is fine) 2 x a day - then decrease the PPI to every 3 days for 2 weeks and then stop while continuing on the zantac or pepcid twice daily. - then you can try once at night for 2  weeks - you can continue on this once at night or stop all together - Avoid alcohol, spicy foods, NSAIDS (aleve, ibuprofen) at this time. See foods below.   Food Choices for Gastroesophageal Reflux Disease When you have gastroesophageal reflux disease (GERD), the foods you eat and your eating habits are very important. Choosing the right foods can help ease the discomfort of GERD. WHAT GENERAL GUIDELINES DO I NEED TO FOLLOW?  Choose fruits, vegetables, whole grains, low-fat dairy products, and low-fat meat, fish, and poultry.  Limit fats such as oils, salad dressings, butter, nuts, and avocado.  Keep a food diary to identify foods that cause symptoms.  Avoid foods that cause reflux. These may be different for different people.  Eat frequent small meals instead of three large meals each day.  Eat your meals slowly, in a relaxed setting.  Limit fried foods.  Cook foods using methods other than frying.  Avoid drinking alcohol.  Avoid drinking large amounts of liquids with your meals.  Avoid bending over or lying down until 2-3 hours after eating. WHAT FOODS ARE NOT RECOMMENDED? The following are some foods and drinks that may worsen your symptoms: Vegetables Tomatoes. Tomato juice. Tomato and spaghetti sauce. Chili peppers. Onion and garlic. Horseradish. Fruits Oranges, grapefruit, and lemon (fruit and juice). Meats High-fat meats, fish, and poultry. This includes hot dogs, ribs, ham, sausage, salami, and bacon. Dairy Whole milk and chocolate milk. Sour cream. Cream. Butter. Ice cream. Cream cheese.  Beverages Coffee and tea, with or without caffeine. Carbonated beverages or energy drinks. Condiments Hot sauce. Barbecue sauce.  Sweets/Desserts Chocolate and cocoa. Donuts. Peppermint and spearmint. Fats and Oils High-fat foods, including [place] fries and potato chips. Other Vinegar. Strong spices, such as black pepper, white pepper, red pepper, cayenne, curry powder,  cloves, ginger, and chili powder.

## 2014-09-30 NOTE — Progress Notes (Signed)
Assessment and Plan:  1. Hypertension -Continue medication, monitor blood pressure at home. Continue DASH diet.  Reminder to go to the ER if any CP, SOB, nausea, dizziness, severe HA, changes vision/speech, left arm numbness and tingling and jaw pain.  2. ADD -  Continue ADD medication, helps with focus, no AE's. The patient was counseled on the addictive nature of the medication and was encouraged to take drug holidays when not needed.   3. Smoking cessation -  instruction/counseling given, counseled patient on the dangers of tobacco use, advised patient to stop smoking, and reviewed strategies to maximize success,  4. Vitamin D Def - check level and continue medications.   5. Seboric Keratosis Benign, will monitor in the office.    Continue diet and meds as discussed. Further disposition pending results of labs.  HPI 44 y.o. female  presents for 3 month follow up   has a past medical history of Asthma; Depression; GERD (gastroesophageal reflux disease); Anemia; and ADD (attention deficit disorder).  Her blood pressure has been controlled at home, today their BP is BP: 110/70 mmHg  She does not workout. She denies chest pain, shortness of breath, dizziness. Patient is on an ADD medication, she states that the medication is helping and she denies any adverse reactions.  Had gastroenteritis last week, went to ER, got fluids.  She is off zoloft and on celexa and feels that she is doing better with weight loss.  Has mole on inner left thigh that she has been watching wants checked out.  BMI is Body mass index is 29.25 kg/(m^2)., she is working on diet and exercise. Wt Readings from Last 3 Encounters:  09/30/14 173 lb (78.472 kg)  09/17/14 171 lb (77.565 kg)  05/10/14 187 lb (84.823 kg)   Current Medications:  Current Outpatient Prescriptions on File Prior to Visit  Medication Sig Dispense Refill  . ALPRAZolam (XANAX) 0.5 MG tablet Take 1 tablet (0.5 mg total) by mouth 2 (two) times  daily as needed for anxiety or sleep. 60 tablet 1  . amphetamine-dextroamphetamine (ADDERALL) 20 MG tablet Take 1 tablet (20 mg total) by mouth 2 (two) times daily. MDD: 2 tablets per day. 60 tablet 0  . Antipyrine-Benzocaine (AURALGAN) 54-14 MG/ML SOLN Place 2 drops into the right ear every 2 (two) hours as needed for pain. 1 Bottle 2  . busPIRone (BUSPAR) 10 MG tablet Take 10 mg by mouth 3 (three) times daily.    . citalopram (CELEXA) 40 MG tablet TAKE ONE TABLET BY MOUTH ONCE DAILY 30 tablet 0  . esomeprazole (NEXIUM) 20 MG capsule Take 20 mg by mouth daily at 12 noon. OTC    . ibuprofen (ADVIL,MOTRIN) 800 MG tablet Take 1 tablet (800 mg total) by mouth every 8 (eight) hours as needed for mild pain or moderate pain. 15 tablet 0  . loperamide (IMODIUM) 2 MG capsule Take 1 capsule (2 mg total) by mouth 4 (four) times daily as needed for diarrhea or loose stools. 12 capsule 0  . ondansetron (ZOFRAN) 4 MG tablet Take 1 tablet (4 mg total) by mouth every 8 (eight) hours as needed for nausea or vomiting. 15 tablet 0  . sertraline (ZOLOFT) 100 MG tablet TAKE TWO TABLETS BY MOUTH ONCE DAILY 60 tablet 0   No current facility-administered medications on file prior to visit.   Medical History:  Past Medical History  Diagnosis Date  . Asthma   . Depression   . GERD (gastroesophageal reflux disease)   . Anemia   .  ADD (attention deficit disorder)    Allergies:  Allergies  Allergen Reactions  . Penicillins Nausea And Vomiting    Review of Systems:  Review of Systems  Constitutional: Negative.   HENT: Negative.   Eyes: Negative.   Respiratory: Negative.   Cardiovascular: Negative.   Gastrointestinal: Negative.   Genitourinary: Negative.   Musculoskeletal: Negative.   Skin: Negative.        Abnormal nodule   Neurological: Negative.   Endo/Heme/Allergies: Negative.   Psychiatric/Behavioral: Negative.     Family history- Review and unchanged Social history- Review and  unchanged Physical Exam: BP 110/70 mmHg  Pulse 88  Temp(Src) 98.2 F (36.8 C)  Resp 16  Ht 5' 4.5" (1.638 m)  Wt 173 lb (78.472 kg)  BMI 29.25 kg/m2  LMP 08/27/2014 Wt Readings from Last 3 Encounters:  09/30/14 173 lb (78.472 kg)  09/17/14 171 lb (77.565 kg)  05/10/14 187 lb (84.823 kg)   General Appearance: Well nourished, in no apparent distress. Eyes: PERRLA, EOMs, conjunctiva no swelling or erythema Sinuses: No Frontal/maxillary tenderness ENT/Mouth: Ext aud canals clear, TMs without erythema, bulging. No erythema, swelling, or exudate on post pharynx.  Tonsils not swollen or erythematous. Hearing normal.  Neck: Supple, thyroid normal.  Respiratory: Respiratory effort normal, BS equal bilaterally without rales, rhonchi, wheezing or stridor.  Cardio: RRR with no MRGs. Brisk peripheral pulses without edema.  Abdomen: Soft, + BS,  Non tender, no guarding, rebound, hernias, masses. Lymphatics: Non tender without lymphadenopathy.  Musculoskeletal: Full ROM, 5/5 strength, Normal gait Skin: Left inner thigh with 3x234mm stuck on scaly/white nodule without erythema.  Warm, dry without rashes,ecchymosis.  Neuro: Cranial nerves intact. Normal muscle tone, no cerebellar symptoms. Psych: Awake and oriented X 3, normal affect, Insight and Judgment appropriate.    Quentin Mullingollier, Kongmeng Santoro, PA-C 9:04 AM [facility]

## 2014-11-16 ENCOUNTER — Other Ambulatory Visit: Payer: Self-pay | Admitting: Physician Assistant

## 2014-11-17 MED ORDER — AMPHETAMINE-DEXTROAMPHETAMINE 20 MG PO TABS: 20.0000 mg | ORAL_TABLET | Freq: Two times a day (BID) | ORAL | Status: DC

## 2014-11-17 NOTE — Addendum Note (Signed)
Addended by: Quentin MullingOLLIER, Henryk Ursin R on: 11/17/2014 08:22 AM   Modules accepted: Orders

## 2014-12-04 ENCOUNTER — Encounter: Payer: Self-pay | Admitting: Physician Assistant

## 2014-12-06 MED ORDER — ALPRAZOLAM 0.5 MG PO TABS: 0.5000 mg | ORAL_TABLET | Freq: Two times a day (BID) | ORAL | Status: DC | PRN

## 2014-12-14 ENCOUNTER — Other Ambulatory Visit: Payer: Self-pay | Admitting: Physician Assistant

## 2014-12-15 MED ORDER — AMPHETAMINE-DEXTROAMPHETAMINE 20 MG PO TABS: 20.0000 mg | ORAL_TABLET | Freq: Two times a day (BID) | ORAL | Status: DC

## 2014-12-15 NOTE — Addendum Note (Signed)
Addended by: Quentin MullingOLLIER, Geneen Dieter R on: 12/15/2014 08:09 AM   Modules accepted: Orders

## 2015-01-12 ENCOUNTER — Other Ambulatory Visit: Payer: Self-pay | Admitting: Physician Assistant

## 2015-01-12 MED ORDER — AMPHETAMINE-DEXTROAMPHETAMINE 20 MG PO TABS: 20.0000 mg | ORAL_TABLET | Freq: Two times a day (BID) | ORAL | Status: DC

## 2015-01-12 NOTE — Addendum Note (Signed)
Addended by: Quentin MullingOLLIER, Paiton Boultinghouse R on: 01/12/2015 10:06 AM   Modules accepted: Orders

## 2015-01-13 MED ORDER — AMPHETAMINE-DEXTROAMPHETAMINE 20 MG PO TABS: 20.0000 mg | ORAL_TABLET | Freq: Two times a day (BID) | ORAL | Status: DC

## 2015-01-13 NOTE — Addendum Note (Signed)
Addended by: Quentin MullingOLLIER, Rogelio Waynick R on: 01/13/2015 01:16 PM   Modules accepted: Orders

## 2015-02-10 ENCOUNTER — Other Ambulatory Visit: Payer: Self-pay | Admitting: Physician Assistant

## 2015-02-11 MED ORDER — AMPHETAMINE-DEXTROAMPHETAMINE 20 MG PO TABS: 20.0000 mg | ORAL_TABLET | Freq: Two times a day (BID) | ORAL | Status: DC

## 2015-02-11 NOTE — Addendum Note (Signed)
Addended by: Doree Albee on: 02/11/2015 08:44 AM   Modules accepted: Orders

## 2015-03-05 ENCOUNTER — Other Ambulatory Visit: Payer: Self-pay | Admitting: Physician Assistant

## 2015-03-15 ENCOUNTER — Other Ambulatory Visit: Payer: Self-pay | Admitting: Physician Assistant

## 2015-03-15 MED ORDER — AMPHETAMINE-DEXTROAMPHETAMINE 20 MG PO TABS: 20.0000 mg | ORAL_TABLET | Freq: Two times a day (BID) | ORAL | Status: DC

## 2015-03-24 ENCOUNTER — Encounter: Payer: Self-pay | Admitting: Physician Assistant

## 2015-03-24 ENCOUNTER — Ambulatory Visit (INDEPENDENT_AMBULATORY_CARE_PROVIDER_SITE_OTHER): Payer: Self-pay | Admitting: Physician Assistant

## 2015-03-24 VITALS — BP 150/90 | HR 66 | Temp 97.9°F | Resp 16 | Ht 64.5 in | Wt 158.2 lb

## 2015-03-24 DIAGNOSIS — J0100 Acute maxillary sinusitis, unspecified: Secondary | ICD-10-CM

## 2015-03-24 MED ORDER — AZITHROMYCIN 250 MG PO TABS: ORAL_TABLET | ORAL | Status: AC

## 2015-03-24 MED ORDER — PREDNISONE 20 MG PO TABS: ORAL_TABLET | ORAL | Status: DC

## 2015-03-24 NOTE — Progress Notes (Signed)
   Subjective:    Patient ID: Vanessa Lee, female    DOB: May 30, 1971, 44 y.o.   MRN: [id number]  HPI 44 y.o. WF with history of ADD, HTN, vitamin D, smoking cessation. She has lost weight with her new job, because she is walking a lot more, does not sit down, she has stopped drinking soda and beer and doing well.  She has had sinus pressure, ear pain, cervical neck glands are swollen, sore throat, chills last night for 6 days. Has been on tylenol. She is on adderall twice a day and xanax once at night at bed time.   Blood pressure 150/90, pulse 66, temperature 97.9 F (36.6 C), resp. rate 16, height 5' 4.5" (1.638 m), weight 158 lb 3.2 oz (71.759 kg).  BMI is Body mass index is 26.75 kg/(m^2)., she is working on diet and exercise. Wt Readings from Last 3 Encounters:  03/24/15 158 lb 3.2 oz (71.759 kg)  09/30/14 173 lb (78.472 kg)  09/17/14 171 lb (77.565 kg)   Current Outpatient Prescriptions on File Prior to Visit  Medication Sig Dispense Refill  . ALPRAZolam (XANAX) 0.5 MG tablet take 1 tablet by mouth twice a day if needed for anxiety or sleep 60 tablet 1  . amphetamine-dextroamphetamine (ADDERALL) 20 MG tablet Take 1 tablet (20 mg total) by mouth 2 (two) times daily. MDD: 2 tablets per day. 60 tablet 0  . citalopram (CELEXA) 40 MG tablet Take 1 tablet (40 mg total) by mouth daily. 90 tablet 1  . esomeprazole (NEXIUM) 20 MG capsule Take 20 mg by mouth daily at 12 noon. OTC     No current facility-administered medications on file prior to visit.   Past Medical History  Diagnosis Date  . Asthma   . Depression   . GERD (gastroesophageal reflux disease)   . Anemia   . ADD (attention deficit disorder)     Review of Systems  Constitutional: Negative for chills and diaphoresis.  HENT: Positive for congestion, postnasal drip, sinus pressure and sneezing. Negative for ear pain and sore throat.   Respiratory: Positive for cough. Negative for chest tightness, shortness of breath and  wheezing.   Cardiovascular: Negative.   Gastrointestinal: Negative.   Genitourinary: Negative.   Musculoskeletal: Negative for neck pain.  Neurological: Positive for headaches.      Objective:   Physical Exam  Constitutional: She appears well-developed and well-nourished.  HENT:  Head: Normocephalic and atraumatic.  Right Ear: External ear normal.  Nose: Right sinus exhibits maxillary sinus tenderness. Right sinus exhibits no frontal sinus tenderness. Left sinus exhibits maxillary sinus tenderness. Left sinus exhibits no frontal sinus tenderness.  Eyes: Conjunctivae and EOM are normal.  Neck: Normal range of motion. Neck supple.  Cardiovascular: Normal rate, regular rhythm, normal heart sounds and intact distal pulses.   Pulmonary/Chest: Effort normal and breath sounds normal. No respiratory distress. She has no wheezes.  Abdominal: Soft. Bowel sounds are normal.  Lymphadenopathy:    She has cervical adenopathy.  Skin: Skin is warm and dry.      Assessment & Plan:  Acute maxillary sinusitis, recurrence not specified  zpak, prednisone, increase fluids, allergy pill, call if she is not getting better

## 2015-03-24 NOTE — Patient Instructions (Signed)
Sinusitis can be uncomfortable. People with sinusitis have congestion with yellow/green/gray discharge, sinus pain/pressure, pain around the eyes. Sinus infections almost ALWAYS stem from a viral infection and antibiotics don't work against a virus. Even when bacteria is responsible, the infections usually clear up on their own in a week or so.   PLEASE TRY TO DO OVER THE COUNTER TREATMENT AND PREDNISONE FOR 5-7 DAYS AND IF YOU ARE NOT GETTING BETTER OR GETTING WORSE THEN YOU CAN START ON AN ANTIBIOTIC GIVEN.  Can take the prednisone AT NIGHT WITH DINNER, it take 8-12 hours to start working so it will NOT affect your sleeping if you take it at night with your food!! Take two pills the first night and 1 or two pill the second night and then 1 pill the other nights.   Risk of antibiotic use: About 1 in 4 people who take antibiotics have side effects including stomach problems, dizziness, or rashes. Those problems clear up soon after stopping the drugs, but in rare cases antibiotics can cause severe allergic reaction. Over use of antibiotics also encourages the growth of bacteria that can't be controlled easily with drugs. That makes you more vunerable to antibiotic-resistant infections and undermines the benefits of antibiotics for others.   Waste of Money: Antibiotics often aren't very expensive, but any money spent on unnecessary drugs is money down the drain.   When are antibiotics needed? Only when symptoms last longer than a week.  Start to improve but then worsen again  -It can take up to 2 weeks to feel better.   -If you do not get better in 7-10 days (Have fever, facial pain, dental pain and swelling), then please call the office and it is now appropriate to start an antibiotic.   -Please take Tylenol or Ibuprofen for pain. -Acetaminiphen 325mg orally every 4-6 hours for pain.  Max: 10 per day -Ibuprofen 200mg orally every 6-8 hours for pain.  Take with food to avoid ulcers.   Max 10 per  day  Please pick one of the over the counter allergy medications below and take it once daily for allergies.  Claritin or loratadine cheapest but likely the weakest  Zyrtec or certizine at night because it can make you sleepy The strongest is allegra or fexafinadine  Cheapest at [facility], [facility], [facility]  -While drinking fluids, pinch and hold nose close and swallow.  This will help open up your eustachian tubes to drain the fluid behind your ear drums. -Try steam showers to open your nasal passages.   Drink lots of water to stay hydrated and to thin mucous.  Flonase/Nasonex is to help the inflammation.  Take 2 sprays in each nostril at bedtime.  Make sure you spray towards the outside of each nostril towards the outer corner of your eye, hold nose close and tilt head back.  This will help the medication get into your sinuses.  If you do not like this medication, then use saline nasal sprays same directions as above for Flonase. Stop the medication right away if you get blurring of your vision or nose bleeds.  Sinusitis Sinusitis is redness, soreness, and inflammation of the paranasal sinuses. Paranasal sinuses are air pockets within the bones of your face (beneath the eyes, the middle of the forehead, or above the eyes). In healthy paranasal sinuses, mucus is able to drain out, and air is able to circulate through them by way of your nose. However, when your paranasal sinuses are inflamed, mucus and air can   become trapped. This can allow bacteria and other germs to grow and cause infection. Sinusitis can develop quickly and last only a short time (acute) or continue over a long period (chronic). Sinusitis that lasts for more than 12 weeks is considered chronic.  CAUSES  Causes of sinusitis include: Allergies. Structural abnormalities, such as displacement of the cartilage that separates your nostrils (deviated septum), which can decrease the air flow through your nose and sinuses and affect sinus  drainage. Functional abnormalities, such as when the small hairs (cilia) that line your sinuses and help remove mucus do not work properly or are not present. SIGNS AND SYMPTOMS  Symptoms of acute and chronic sinusitis are the same. The primary symptoms are pain and pressure around the affected sinuses. Other symptoms include: Upper toothache. Earache. Headache. Bad breath. Decreased sense of smell and taste. A cough, which worsens when you are lying flat. Fatigue. Fever. Thick drainage from your nose, which often is green and may contain pus (purulent). Swelling and warmth over the affected sinuses. DIAGNOSIS  Your health care provider will perform a physical exam. During the exam, your health care provider may: Look in your nose for signs of abnormal growths in your nostrils (nasal polyps).  Tap over the affected sinus to check for signs of infection. View the inside of your sinuses (endoscopy) using an imaging device that has a light attached (endoscope). If your health care provider suspects that you have chronic sinusitis, one or more of the following tests may be recommended: Allergy tests. Nasal culture. A sample of mucus is taken from your nose, sent to a lab, and screened for bacteria. Nasal cytology. A sample of mucus is taken from your nose and examined by your health care provider to determine if your sinusitis is related to an allergy. TREATMENT  Most cases of acute sinusitis are related to a viral infection and will resolve on their own within 10 days. Sometimes medicines are prescribed to help relieve symptoms (pain medicine, decongestants, nasal steroid sprays, or saline sprays).  However, for sinusitis related to a bacterial infection, your health care provider will prescribe antibiotic medicines. These are medicines that will help kill the bacteria causing the infection.  Rarely, sinusitis is caused by a fungal infection. In theses cases, your health care provider will  prescribe antifungal medicine. For some cases of chronic sinusitis, surgery is needed. Generally, these are cases in which sinusitis recurs more than 3 times per year, despite other treatments. HOME CARE INSTRUCTIONS  Drink plenty of water. Water helps thin the mucus so your sinuses can drain more easily. Use a humidifier. Inhale steam 3 to 4 times a day (for example, sit in the bathroom with the shower running). Apply a warm, moist washcloth to your face 3 to 4 times a day, or as directed by your health care provider. Use saline nasal sprays to help moisten and clean your sinuses. Take medicines only as directed by your health care provider. If you were prescribed either an antibiotic or antifungal medicine, finish it all even if you start to feel better. SEEK IMMEDIATE MEDICAL CARE IF: You have increasing pain or severe headaches. You have nausea, vomiting, or drowsiness. You have swelling around your face. You have vision problems. You have a stiff neck. You have difficulty breathing. MAKE SURE YOU:  Understand these instructions. Will watch your condition. Will get help right away if you are not doing well or get worse. Document Released: 07/03/2005 Document Revised: 11/17/2013 Document Reviewed: 07/18/2011 ExitCare   Patient Information 2015 [employer]. This information is not intended to replace advice given to you by your health care provider. Make sure you discuss any questions you have with your health care provider.   

## 2015-04-05 ENCOUNTER — Ambulatory Visit: Payer: Self-pay | Admitting: Physician Assistant

## 2015-04-14 ENCOUNTER — Other Ambulatory Visit: Payer: Self-pay | Admitting: Internal Medicine

## 2015-04-14 ENCOUNTER — Other Ambulatory Visit: Payer: Self-pay | Admitting: Physician Assistant

## 2015-04-14 MED ORDER — AMPHETAMINE-DEXTROAMPHETAMINE 20 MG PO TABS: 20.0000 mg | ORAL_TABLET | Freq: Two times a day (BID) | ORAL | Status: DC

## 2015-05-04 ENCOUNTER — Other Ambulatory Visit: Payer: Self-pay | Admitting: Physician Assistant

## 2015-05-05 ENCOUNTER — Other Ambulatory Visit: Payer: Self-pay | Admitting: Physician Assistant

## 2015-05-05 DIAGNOSIS — F411 Generalized anxiety disorder: Secondary | ICD-10-CM

## 2015-05-05 MED ORDER — ALPRAZOLAM 0.5 MG PO TABS: 0.5000 mg | ORAL_TABLET | Freq: Two times a day (BID) | ORAL | Status: DC | PRN

## 2015-05-12 ENCOUNTER — Encounter: Payer: Self-pay | Admitting: Physician Assistant

## 2015-05-12 ENCOUNTER — Ambulatory Visit: Payer: Self-pay | Admitting: Physician Assistant

## 2015-05-12 ENCOUNTER — Ambulatory Visit (INDEPENDENT_AMBULATORY_CARE_PROVIDER_SITE_OTHER): Payer: Self-pay | Admitting: Physician Assistant

## 2015-05-12 VITALS — BP 120/80 | HR 84 | Temp 98.4°F | Resp 16 | Ht 64.5 in | Wt 159.0 lb

## 2015-05-12 DIAGNOSIS — F909 Attention-deficit hyperactivity disorder, unspecified type: Secondary | ICD-10-CM

## 2015-05-12 DIAGNOSIS — R002 Palpitations: Secondary | ICD-10-CM

## 2015-05-12 DIAGNOSIS — D649 Anemia, unspecified: Secondary | ICD-10-CM

## 2015-05-12 DIAGNOSIS — F17200 Nicotine dependence, unspecified, uncomplicated: Secondary | ICD-10-CM

## 2015-05-12 DIAGNOSIS — F32A Depression, unspecified: Secondary | ICD-10-CM

## 2015-05-12 DIAGNOSIS — F329 Major depressive disorder, single episode, unspecified: Secondary | ICD-10-CM

## 2015-05-12 DIAGNOSIS — F988 Other specified behavioral and emotional disorders with onset usually occurring in childhood and adolescence: Secondary | ICD-10-CM

## 2015-05-12 DIAGNOSIS — J45909 Unspecified asthma, uncomplicated: Secondary | ICD-10-CM

## 2015-05-12 DIAGNOSIS — K219 Gastro-esophageal reflux disease without esophagitis: Secondary | ICD-10-CM

## 2015-05-12 MED ORDER — AMPHETAMINE-DEXTROAMPHETAMINE 20 MG PO TABS: 20.0000 mg | ORAL_TABLET | Freq: Two times a day (BID) | ORAL | Status: DC

## 2015-05-12 NOTE — Progress Notes (Signed)
Assessment and Plan:  1. Hypertension -Continue medication, monitor blood pressure at home. Continue DASH diet.  Reminder to go to the ER if any CP, SOB, nausea, dizziness, severe HA, changes vision/speech, left arm numbness and tingling and jaw pain.  2. ADD -  Continue ADD medication, helps with focus, no AE's. The patient was counseled on the addictive nature of the medication and was encouraged to take drug holidays when not needed.   3. Smoking cessation - she is only vaping.   4. Vitamin D Def - check level and continue medications.   5. Palpitations Check CMET, TSH will call and get labs done.  Continue celexa and can take xanax PRN Go to the ER if any CP, SOB, nausea, dizziness, severe HA, changes vision/speech   Continue diet and meds as discussed. Further disposition pending results of labs.  HPI 44 y.o. female  presents for 3 month follow up   has a past medical history of Asthma; Depression; GERD (gastroesophageal reflux disease); Anemia; and ADD (attention deficit disorder).  Her blood pressure has been controlled at home, today their BP is    She does not workout. She denies chest pain, shortness of breath, dizziness. She has had some palpitations recently, can happen with or without exertion, has been worsening stress at work. Will have nausea and dizziness with it, will pass after a few mins, will get better with deep breathing. She is off zoloft Patient is on an ADD medication, she states that the medication is helping and she denies any adverse reactions.  She is off zoloft and on celexa and feels that she is doing better with weight loss.  BMI is Body mass index is 26.88 kg/(m^2)., she is working on diet and exercise. Wt Readings from Last 3 Encounters:  05/12/15 159 lb (72.122 kg)  03/24/15 158 lb 3.2 oz (71.759 kg)  09/30/14 173 lb (78.472 kg)   Current Medications:  Current Outpatient Prescriptions on File Prior to Visit  Medication Sig Dispense Refill  .  ALPRAZolam (XANAX) 0.5 MG tablet Take 1 tablet (0.5 mg total) by mouth 2 (two) times daily as needed. 60 tablet 0  . amphetamine-dextroamphetamine (ADDERALL) 20 MG tablet Take 1 tablet (20 mg total) by mouth 2 (two) times daily. MDD: 2 tablets per day. 60 tablet 0  . citalopram (CELEXA) 40 MG tablet Take 1 tablet (40 mg total) by mouth daily. 90 tablet 1  . esomeprazole (NEXIUM) 20 MG capsule Take 20 mg by mouth daily at 12 noon. OTC    . predniSONE (DELTASONE) 20 MG tablet 2 tablets daily for 3 days, 1 tablet daily for 4 days. 10 tablet 0   No current facility-administered medications on file prior to visit.   Medical History:  Past Medical History  Diagnosis Date  . Asthma   . Depression   . GERD (gastroesophageal reflux disease)   . Anemia   . ADD (attention deficit disorder)    Allergies:  Allergies  Allergen Reactions  . Penicillins Nausea And Vomiting    Review of Systems:  Review of Systems  Constitutional: Negative.   HENT: Negative.   Eyes: Negative.   Respiratory: Negative.   Cardiovascular: Positive for palpitations. Negative for chest pain, orthopnea, claudication, leg swelling and PND.  Gastrointestinal: Negative.   Genitourinary: Negative.   Musculoskeletal: Negative.   Skin: Negative.   Neurological: Negative.   Endo/Heme/Allergies: Negative.   Psychiatric/Behavioral: Negative for depression, suicidal ideas, hallucinations, memory loss and substance abuse. The patient is nervous/anxious.  The patient does not have insomnia.     Family history- Review and unchanged Social history- Review and unchanged Physical Exam: Ht 5' 4.5" (1.638 m)  Wt 159 lb (72.122 kg)  BMI 26.88 kg/m2  LMP 04/19/2015 Wt Readings from Last 3 Encounters:  05/12/15 159 lb (72.122 kg)  03/24/15 158 lb 3.2 oz (71.759 kg)  09/30/14 173 lb (78.472 kg)   General Appearance: Well nourished, in no apparent distress. Eyes: PERRLA, EOMs, conjunctiva no swelling or erythema Sinuses: No  Frontal/maxillary tenderness ENT/Mouth: Ext aud canals clear, TMs without erythema, bulging. No erythema, swelling, or exudate on post pharynx.  Tonsils not swollen or erythematous. Wears hearing aids.   Neck: Supple, thyroid normal.  Respiratory: Respiratory effort normal, BS equal bilaterally without rales, rhonchi, wheezing or stridor.  Cardio: RRR with no MRGs. Brisk peripheral pulses without edema.  Abdomen: Soft, + BS,  Non tender, no guarding, rebound, hernias, masses. Lymphatics: Non tender without lymphadenopathy.  Musculoskeletal: Full ROM, 5/5 strength, Normal gait Skin: Warm, dry without rashes,ecchymosis.  Neuro: Cranial nerves intact. Normal muscle tone, no cerebellar symptoms. Psych: Awake and oriented X 3, normal affect, Insight and Judgment appropriate.    Quentin MullingAmanda Collier, PA-C 3:01 PM [facility]

## 2015-05-12 NOTE — Patient Instructions (Signed)

## 2015-05-31 ENCOUNTER — Telehealth: Payer: Self-pay | Admitting: Internal Medicine

## 2015-05-31 DIAGNOSIS — F411 Generalized anxiety disorder: Secondary | ICD-10-CM

## 2015-05-31 MED ORDER — ALPRAZOLAM 0.5 MG PO TABS: 0.5000 mg | ORAL_TABLET | Freq: Two times a day (BID) | ORAL | Status: DC | PRN

## 2015-05-31 NOTE — Addendum Note (Signed)
Addended by: Quentin MullingOLLIER, Anas Reister R on: 05/31/2015 12:51 PM   Modules accepted: Orders

## 2015-05-31 NOTE — Telephone Encounter (Signed)
Rx called into [facility] [address]

## 2015-06-10 IMAGING — CR DG CHEST 2V
2 series · 2 of 2 positions shown · non-contrast
Comparison: None.

CLINICAL DATA: Acute cough and congestion, history of asthma

EXAM:
CHEST  2 VIEW

[w chest pa]
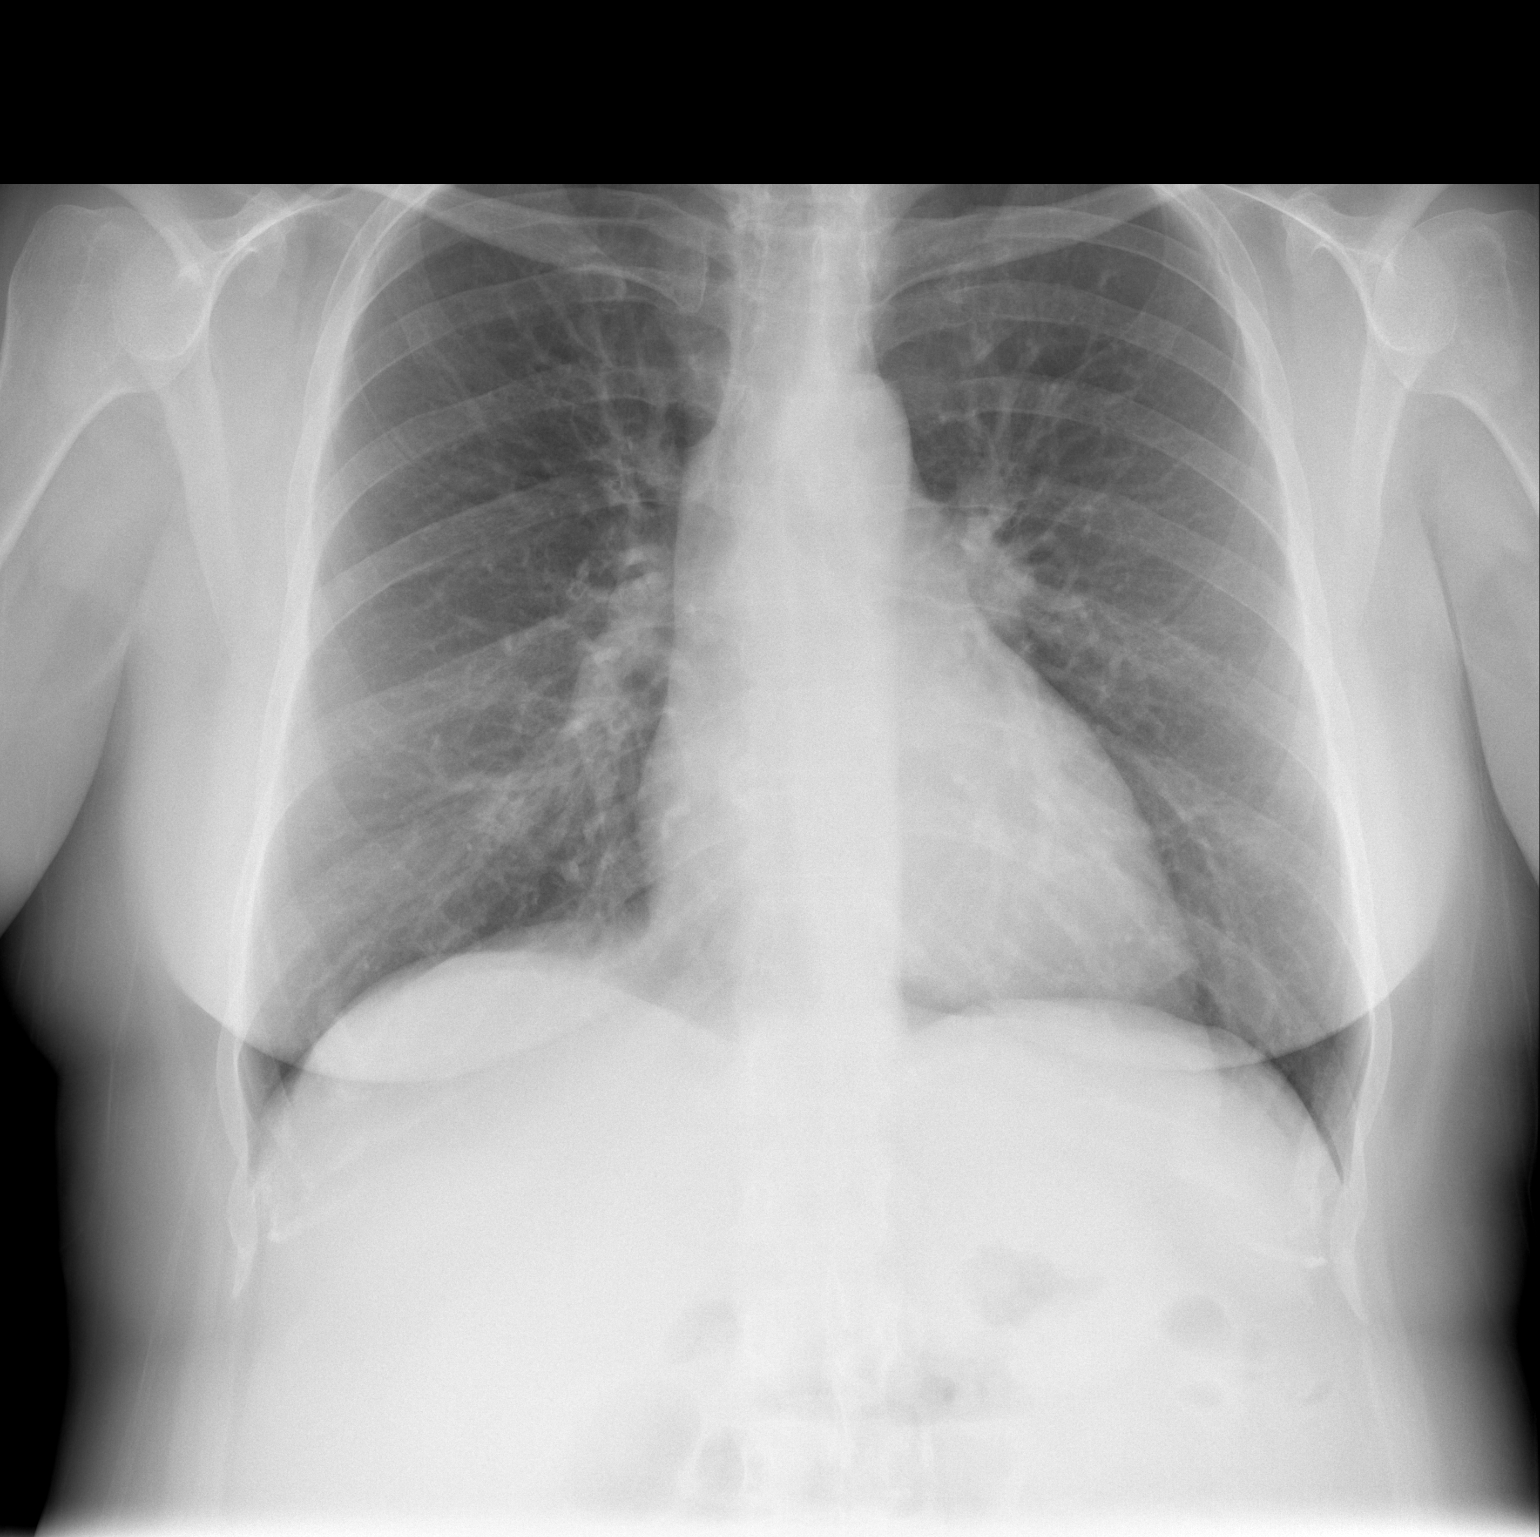

[w chest lat]
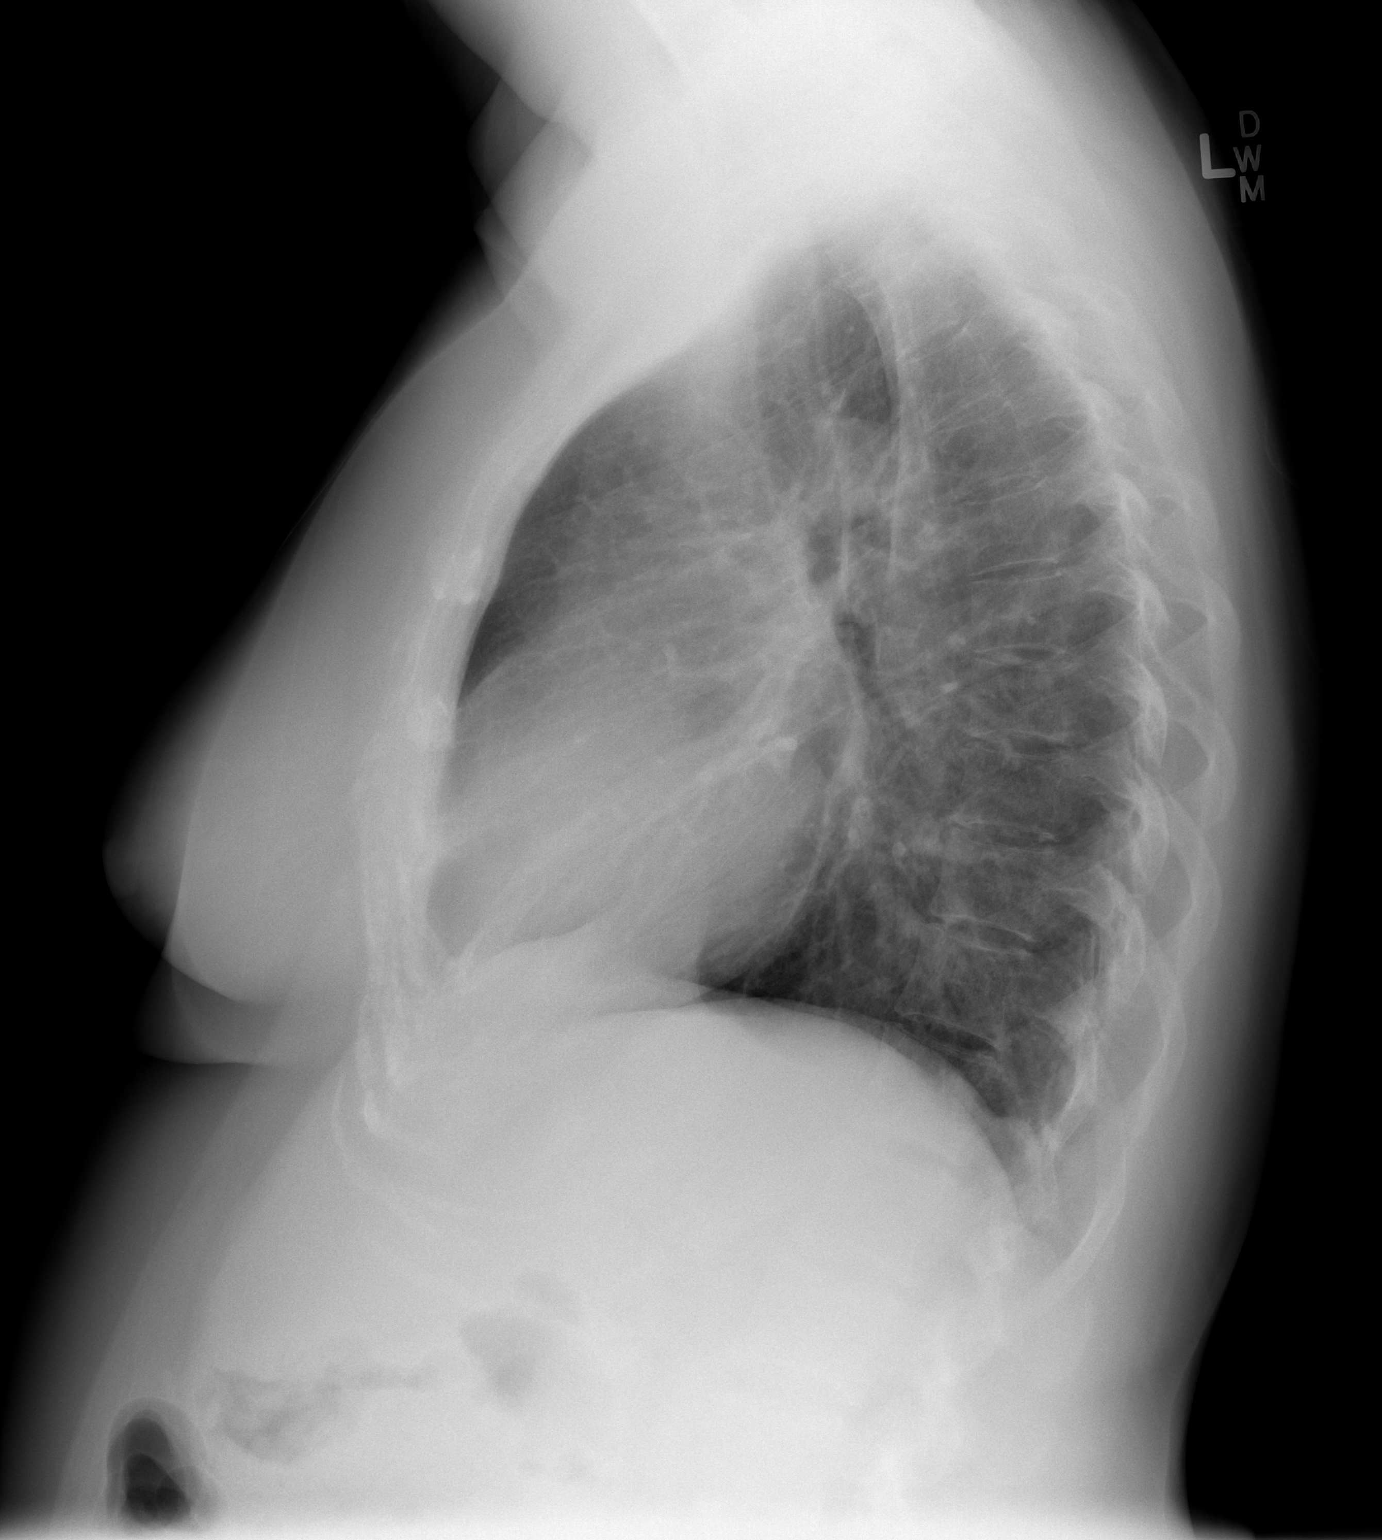

[2 of 2 positions shown; findings below may reference images not displayed]

FINDINGS: The heart size and mediastinal contours are within normal limits.
Both lungs are clear. The visualized skeletal structures are
unremarkable.
IMPRESSION: No active cardiopulmonary disease.

## 2015-06-14 ENCOUNTER — Other Ambulatory Visit: Payer: Self-pay | Admitting: Physician Assistant

## 2015-06-14 ENCOUNTER — Other Ambulatory Visit: Payer: Self-pay | Admitting: Internal Medicine

## 2015-06-14 MED ORDER — AMPHETAMINE-DEXTROAMPHETAMINE 20 MG PO TABS: 20.0000 mg | ORAL_TABLET | Freq: Two times a day (BID) | ORAL | Status: DC

## 2015-06-17 ENCOUNTER — Other Ambulatory Visit: Payer: Self-pay | Admitting: *Deleted

## 2015-06-17 MED ORDER — AMPHETAMINE-DEXTROAMPHETAMINE 20 MG PO TABS: 20.0000 mg | ORAL_TABLET | Freq: Two times a day (BID) | ORAL | Status: DC

## 2015-06-24 ENCOUNTER — Encounter: Payer: Self-pay | Admitting: Physician Assistant

## 2015-06-24 ENCOUNTER — Ambulatory Visit (INDEPENDENT_AMBULATORY_CARE_PROVIDER_SITE_OTHER): Payer: Self-pay | Admitting: Physician Assistant

## 2015-06-24 VITALS — BP 150/100 | HR 114 | Temp 97.7°F | Resp 16 | Ht 64.5 in | Wt 158.0 lb

## 2015-06-24 DIAGNOSIS — T7411XA Adult physical abuse, confirmed, initial encounter: Secondary | ICD-10-CM

## 2015-06-24 DIAGNOSIS — M940 Chondrocostal junction syndrome [Tietze]: Secondary | ICD-10-CM

## 2015-06-24 MED ORDER — TRAMADOL HCL 50 MG PO TABS: ORAL_TABLET | ORAL | Status: DC

## 2015-06-24 MED ORDER — BACLOFEN 10 MG PO TABS: 10.0000 mg | ORAL_TABLET | Freq: Two times a day (BID) | ORAL | Status: DC

## 2015-06-24 MED ORDER — PREDNISONE 20 MG PO TABS: ORAL_TABLET | ORAL | Status: DC

## 2015-06-24 NOTE — Patient Instructions (Addendum)
Domestic Violence Information WHAT IS DOMESTIC VIOLENCE?  Domestic violence, also called intimate partner violence, can involve physical, emotional, psychological, sexual, and economic abuse by a current or former intimate partner. Stalking is also considered a type of domestic violence. Domestic violence can happen between people who are or were:   Married.  Dating.  Living together. Abusers repeatedly act to maintain control and power over their partner. Physical abuse can include:  Slapping.   Hitting.   Kicking.   Punching.  Choking.  Pulling the victim's hair.  Damaging the victim's property.  Threatening or hurting the victim with weapons.  Trapping the victim in his or her home.  Forcing the victim to use drugs or alcohol. Emotional and psychological abuse can include:  Threats.  Insults.   Isolation.   Humiliation.  Jealousy and possessiveness.  Blame.  Withholding affection.   Intimidation.   Manipulation.   Limiting contact with friends and family.  Sexual abuse can include:  Forcing sex.   Forcing sexual touching.   Hurting the victim during sex.  Forcing the victim to have sex with other people.  Giving the victim a sexually transmitted disease (STD) on purpose. Economic abuse can include:  Controlling resources, such as money, food, transportation, a phone, or computer.  Stealing money from the victim or his or her family or friends.  Forbidding the victim to work.  Refusing to work or to contribute to the household. Stalking can include:  Making repeated, unwanted phone calls, emails, or text messages.  Leaving cards, letters, flowers or other items the victim does not want.  Watching or following the victim from a distance.  Going to places where the victim does not want the abuser.  Entering the victim's home or car.  Damaging the victim's personal property. WHAT ARE SOME WARNING SIGNS OF DOMESTIC  VIOLENCE?  Physical signs  Bruises.   Broken bones.   Burns or cuts.   Physical pain.   Head injury. Emotional and psychological signs  Crying.   Depression.   Hopelessness.   Desperation.   Trouble sleeping.   Fear of partner.   Anxiety.  Suicidal behavior.  Antisocial behavior.  Low self-esteem.  Fear of intimacy.  Flashbacks. Sexual signs  Bruising, swelling, or bleeding of the genital or rectal area.   Signs of a sexually transmitted infection, such as genital sores, warts, or discharge coming from the genital area.   Pain in the genital area.   Unintended pregnancy.  Problems with pregnancy, including delayed prenatal care and prematurity. Economic signs  Having little money or food.   Homelessness.  Asking for or borrowing money. WHAT ARE COMMON BEHAVIORS OF THOSE AFFECTED BY DOMESTIC VIOLENCE? Those affected by domestic violence may:   Be late to work or other events.   Not show up to places as promised.   Have to let their partner know where they are and who they are with.   Be isolated or kept from seeing friends or family.   Make comments about their partner's temper or behavior.   Make excuses for their partner.   Engage in high-risk sexual behaviors.  Use drugs or alcohol.  Have unhealthy diet-related behaviors. WHAT ARE COMMON FEELINGS OF THOSE AFFECTED BY DOMESTIC VIOLENCE?  Those affected by domestic violence may feel that:   They have to be careful not to say or do things that trigger their partner's anger.   They cannot do anything right.   They deserve to be treated badly.  They are overreacting to their partner's behavior or temper.   They cannot trust their own feelings.   They cannot trust other people.   They are trapped.   Their partner would take away their children.   They are emotionally drained or numb.   Their life is in danger.   They might have to kill  their partner to survive.  WHERE CAN YOU GET HELP?  Domestic violence hotlines and websites If you do not feel safe searching for help online at home, use a computer at [employer] to access [employer]. Call 911 if you are in immediate danger or need medical help.   [employer].  The 24-hour phone hotline is (936)679-4735 or 925-848-3364 (TTY).   The videophone is available Monday through Friday, 9 a.m. to 5 p.m. Call [phone].  The website is http://thehotline.org  The National Sexual Assault Hotline.  The 24-hour phone hotline is 6095259696.  You can access the online hotline at [employer] Shelters for victims of domestic violence  If you are a victim of domestic violence, there are resources to help you find a temporary place for you and your children to live (shelter). The specific address of these shelters is often not known to the public.  Police Report assaults, threats, and stalking to the police.  Counselors and counseling centers People who have been victims of domestic violence can benefit from counseling. Counseling can help you cope with difficult emotions and empower you to plan for your future safety. The topics you discuss with a counselor are private and confidential. Children of domestic violence victims also might need counseling to manage stress and anxiety.  The court system You can work with a Clinical research associate or an advocate to get legal protection against an abuser. Protection includes restraining orders and private addresses. Crimes against you, such as assault, can also be prosecuted through the courts. Laws vary by state.   This information is not intended to replace advice given to you by your health care provider. Make sure you discuss any questions you have with your health care provider.   Document Released: 09/23/2003 Document Revised: 07/24/2014 Document Reviewed: 03/20/2014 Elsevier Interactive  Patient Education 2016 [employer].   [facility] Hours: Monday-Thursday 830-8pm  Friday 830AM-7PM Address: [address] [phone]   Costochondritis Costochondritis, sometimes called Tietze syndrome, is a swelling and irritation (inflammation) of the tissue (cartilage) that connects your ribs with your breastbone (sternum). It causes pain in the chest and rib area. Costochondritis usually goes away on its own over time. It can take up to 6 weeks or longer to get better, especially if you are unable to limit your activities. CAUSES  Some cases of costochondritis have no known cause. Possible causes include:  Injury (trauma).  Exercise or activity such as lifting.  Severe coughing. SIGNS AND SYMPTOMS  Pain and tenderness in the chest and rib area.  Pain that gets worse when coughing or taking deep breaths.  Pain that gets worse with specific movements. DIAGNOSIS  Your health care provider will do a physical exam and ask about your symptoms. Chest X-rays or other tests may be done to rule out other problems. TREATMENT  Costochondritis usually goes away on its own over time. Your health care provider may prescribe medicine to help relieve pain. HOME CARE INSTRUCTIONS   Avoid exhausting physical activity. Try not to strain your ribs during normal activity. This would include any activities using chest, abdominal, and  side muscles, especially if heavy weights are used.  Apply ice to the affected area for the first 2 days after the pain begins.  Put ice in a plastic bag.  Place a towel between your skin and the bag.  Leave the ice on for 20 minutes, 2-3 times a day.  Only take over-the-counter or prescription medicines as directed by your health care provider. SEEK MEDICAL CARE IF:  You have redness or swelling at the rib joints. These are signs of infection.  Your pain does not go away despite rest or medicine. SEEK IMMEDIATE MEDICAL CARE  IF:   Your pain increases or you are very uncomfortable.  You have shortness of breath or difficulty breathing.  You cough up blood.  You have worse chest pains, sweating, or vomiting.  You have a fever or persistent symptoms for more than 2-3 days.  You have a fever and your symptoms suddenly get worse. MAKE SURE YOU:   Understand these instructions.  Will watch your condition.  Will get help right away if you are not doing well or get worse.   This information is not intended to replace advice given to you by your health care provider. Make sure you discuss any questions you have with your health care provider.   Document Released: 04/12/2005 Document Revised: 04/23/2013 Document Reviewed: 02/04/2013 Elsevier Interactive Patient Education [employer]2016 Elsevier Inc.
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
## 2015-06-25 ENCOUNTER — Encounter: Payer: Self-pay | Admitting: Physician Assistant
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
## 2015-07-20 ENCOUNTER — Encounter: Payer: Self-pay | Admitting: Physician Assistant

## 2015-07-20 ENCOUNTER — Other Ambulatory Visit: Payer: Self-pay | Admitting: Physician Assistant

## 2015-07-20 DIAGNOSIS — F411 Generalized anxiety disorder: Secondary | ICD-10-CM

## 2015-07-20 MED ORDER — BUPROPION HCL ER (XL) 150 MG PO TB24: 150.0000 mg | ORAL_TABLET | ORAL | Status: DC

## 2015-07-20 MED ORDER — ALPRAZOLAM 0.5 MG PO TABS: 0.5000 mg | ORAL_TABLET | Freq: Three times a day (TID) | ORAL | Status: DC | PRN

## 2015-07-20 NOTE — Progress Notes (Signed)
Rx called into [facility]

## 2015-07-26 ENCOUNTER — Other Ambulatory Visit: Payer: Self-pay | Admitting: Internal Medicine

## 2015-08-02 ENCOUNTER — Other Ambulatory Visit: Payer: Self-pay | Admitting: Internal Medicine

## 2015-08-02 ENCOUNTER — Encounter: Payer: Self-pay | Admitting: Physician Assistant

## 2015-08-02 MED ORDER — AMPHETAMINE-DEXTROAMPHETAMINE 20 MG PO TABS: 20.0000 mg | ORAL_TABLET | Freq: Two times a day (BID) | ORAL | Status: DC

## 2015-08-03 DIAGNOSIS — I10 Essential (primary) hypertension: Secondary | ICD-10-CM

## 2015-08-08 ENCOUNTER — Telehealth: Payer: Self-pay | Admitting: Physician Assistant

## 2015-08-08 DIAGNOSIS — J019 Acute sinusitis, unspecified: Secondary | ICD-10-CM

## 2015-08-08 DIAGNOSIS — H532 Diplopia: Secondary | ICD-10-CM

## 2015-08-08 NOTE — Progress Notes (Signed)
Based on what you shared with me it looks like you have a serious condition that should be evaluated in a face to face office visit. It does sound like a significant bacterial sinus infection but blurry vision/double vision are alarm symptoms that could indicate a more serious infection and needs a quick assessment. I want you to stay hydrated and continue supportive measures. Please go to an Urgent care today or see your primary care provider first thing in the morning. If vision symptoms persist or worsen, please go to the ER for assessment. You really need a good physical examination to make sure nothing more serious is going on.  If you are having a true medical emergency please call 911.  If you need an urgent face to face visit, [place] has four urgent care centers for your convenience.  [facility]  [phone] Get Driving Directions Find a Provider at this Location  [address] [place], [place] [address] . 8 am to 8 pm Monday-Friday . 9 am to 7 pm Saturday-Sunday  . [facility]  [phone] Get Driving Directions Find a Provider at this Location  [address], [address] [place], [place] [address] . 8 am to 8 pm Monday-Friday . 9 am to 6 pm Saturday . 11 am to 6 pm Sunday   . [facility] at [facility]  [phone] Get Driving Directions  [address] [place], [place] [address] . 8 am to 8 pm Monday-Friday . 9 am to 4 pm Saturday-Sunday   . [facility] (a walk in primary care provider)  [phone]  Get Driving Directions Find a Provider at this Location  [address] [place], [place] [address] . 8 am to 8:30 pm Monday-Thursday . 8 am to 6 pm Friday . 8 am to 4 pm Saturday-Sunday   Your e-visit answers were reviewed by a board certified advanced clinical practitioner to complete your personal care plan.  Thank you for using e-Visits.

## 2015-08-09 ENCOUNTER — Telehealth: Payer: Self-pay | Admitting: *Deleted

## 2015-08-09 ENCOUNTER — Encounter: Payer: Self-pay | Admitting: Internal Medicine

## 2015-08-09 ENCOUNTER — Ambulatory Visit (INDEPENDENT_AMBULATORY_CARE_PROVIDER_SITE_OTHER): Payer: Self-pay | Admitting: Internal Medicine

## 2015-08-09 VITALS — BP 142/90 | HR 94 | Temp 98.2°F | Resp 18 | Ht 64.5 in | Wt 148.0 lb

## 2015-08-09 DIAGNOSIS — J0140 Acute pansinusitis, unspecified: Secondary | ICD-10-CM

## 2015-08-09 MED ORDER — PROMETHAZINE-DM 6.25-15 MG/5ML PO SYRP: ORAL_SOLUTION | ORAL | Status: DC

## 2015-08-09 MED ORDER — PREDNISONE 20 MG PO TABS: ORAL_TABLET | ORAL | Status: DC

## 2015-08-09 MED ORDER — AZITHROMYCIN 250 MG PO TABS: ORAL_TABLET | ORAL | Status: DC

## 2015-08-09 NOTE — Progress Notes (Signed)
Patient ID: Vanessa Lee, female   DOB: 1970-11-18, 45 y.o.   MRN: [id number]  HPI  Patient presents to the office for evaluation of sinus pressure and nasal congestion.  It has been going on for 1 weeks.  Patient reports all the time, dry.  They also endorse change in voice, chills, postnasal drip, shortness of breath, wheezing and nasal congestion, eye crusting, green nasal sputum, ear congestion, and sore throat.  She does admit to some blurry vision.  She reports that this seems to come and go.  .  They have tried cold medications, tylenol..  They report that nothing has worked.  They admits to other sick contacts.  Both her children have sinus infections.     Review of Systems  Constitutional: Positive for chills and malaise/fatigue. Negative for fever.  HENT: Positive for congestion, ear pain and sore throat.   Eyes: Positive for blurred vision.  Respiratory: Positive for cough, sputum production, shortness of breath and wheezing.   Cardiovascular: Negative for chest pain, palpitations and leg swelling.  Neurological: Positive for headaches.    PE:  Filed Vitals:   08/09/15 1107  BP: 142/90  Pulse: 94  Temp: 98.2 F (36.8 C)  Resp: 18   General:  Alert and non-toxic, WDWN, NAD HEENT: NCAT, PERLA, EOM normal, no occular discharge or erythema.  Nasal mucosal edema with sinus tenderness to palpation.  Oropharynx clear with minimal oropharyngeal edema and erythema.  Mucous membranes moist and pink. Neck:  Cervical adenopathy Chest:  RRR no MRGs.  Lungs clear to auscultation A&P with no wheezes rhonchi or rales.   Abdomen: +BS x 4 quadrants, soft, non-tender, no guarding, rigidity, or rebound. Skin: warm and dry no rash Neuro: A&Ox4, CN II-XII grossly intact  Assessment and Plan:   1. Acute pansinusitis, recurrence not specified -prednisone -zpak -nasal saline -zyrtec -flonase

## 2015-08-09 NOTE — Telephone Encounter (Signed)
Patient called and left me a voicemail on 1/22 at 6:43pm.  She had done an e-Visit yesterday and said she never heard anything back.  Called patient and informed her that Selena Batten recommended she go to the Urgent Care.  She said that she checked her mychart all day.  I apologized to the patient and told her I would look into this.  Nothing further needed

## 2015-08-09 NOTE — Patient Instructions (Signed)
Please take prednisone as prescribed until it is gone.  Please take zpak until gone.  Please use nasal saline in your nose as often as you can.    Please get flonase, nasacort, or rhinocort generic store brands are okay.  Use 2 sprays in each nostril before bedtime.  Please take zyrtec nightly.  Please take 50 mg of benadryl at night time to help with sleeping.  Please use cough syrup as needed up to 3 times daily.  If it makes you too tired take it only at bedtime.

## 2015-09-01 ENCOUNTER — Other Ambulatory Visit: Payer: Self-pay | Admitting: Internal Medicine

## 2015-09-01 ENCOUNTER — Other Ambulatory Visit: Payer: Self-pay | Admitting: Physician Assistant

## 2015-09-01 MED ORDER — AMPHETAMINE-DEXTROAMPHETAMINE 20 MG PO TABS: 20.0000 mg | ORAL_TABLET | Freq: Two times a day (BID) | ORAL | Status: DC

## 2015-09-14 ENCOUNTER — Other Ambulatory Visit: Payer: Self-pay | Admitting: Physician Assistant

## 2015-09-14 DIAGNOSIS — T7411XA Adult physical abuse, confirmed, initial encounter: Secondary | ICD-10-CM

## 2015-09-14 DIAGNOSIS — M940 Chondrocostal junction syndrome [Tietze]: Secondary | ICD-10-CM

## 2015-10-04 ENCOUNTER — Other Ambulatory Visit: Payer: Self-pay | Admitting: Physician Assistant

## 2015-10-04 DIAGNOSIS — F411 Generalized anxiety disorder: Secondary | ICD-10-CM

## 2015-10-04 MED ORDER — ALPRAZOLAM 0.5 MG PO TABS: 0.5000 mg | ORAL_TABLET | Freq: Three times a day (TID) | ORAL | Status: DC | PRN

## 2015-10-04 MED ORDER — AMPHETAMINE-DEXTROAMPHETAMINE 20 MG PO TABS: 20.0000 mg | ORAL_TABLET | Freq: Two times a day (BID) | ORAL | Status: DC

## 2015-10-18 ENCOUNTER — Ambulatory Visit: Payer: Self-pay | Admitting: Physician Assistant

## 2015-11-10 ENCOUNTER — Ambulatory Visit: Payer: Self-pay | Admitting: Physician Assistant

## 2015-11-10 ENCOUNTER — Ambulatory Visit (INDEPENDENT_AMBULATORY_CARE_PROVIDER_SITE_OTHER): Payer: 59 | Admitting: Internal Medicine

## 2015-11-10 ENCOUNTER — Encounter: Payer: Self-pay | Admitting: Internal Medicine

## 2015-11-10 VITALS — BP 140/88 | HR 112 | Temp 98.0°F | Resp 16 | Ht 64.5 in | Wt 136.0 lb

## 2015-11-10 DIAGNOSIS — Z79899 Other long term (current) drug therapy: Secondary | ICD-10-CM

## 2015-11-10 DIAGNOSIS — E785 Hyperlipidemia, unspecified: Secondary | ICD-10-CM

## 2015-11-10 DIAGNOSIS — L659 Nonscarring hair loss, unspecified: Secondary | ICD-10-CM

## 2015-11-10 DIAGNOSIS — R7303 Prediabetes: Secondary | ICD-10-CM | POA: Diagnosis not present

## 2015-11-10 DIAGNOSIS — F988 Other specified behavioral and emotional disorders with onset usually occurring in childhood and adolescence: Secondary | ICD-10-CM

## 2015-11-10 DIAGNOSIS — F17200 Nicotine dependence, unspecified, uncomplicated: Secondary | ICD-10-CM

## 2015-11-10 DIAGNOSIS — F909 Attention-deficit hyperactivity disorder, unspecified type: Secondary | ICD-10-CM

## 2015-11-10 MED ORDER — ESOMEPRAZOLE MAGNESIUM 20 MG PO CPDR: 20.0000 mg | DELAYED_RELEASE_CAPSULE | Freq: Every day | ORAL | Status: DC

## 2015-11-10 MED ORDER — AMPHETAMINE-DEXTROAMPHETAMINE 20 MG PO TABS: 20.0000 mg | ORAL_TABLET | Freq: Two times a day (BID) | ORAL | Status: DC
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
## 2015-11-22 ENCOUNTER — Encounter: Payer: Self-pay | Admitting: Physician Assistant

## 2015-11-22 ENCOUNTER — Ambulatory Visit (INDEPENDENT_AMBULATORY_CARE_PROVIDER_SITE_OTHER): Payer: 59 | Admitting: Physician Assistant

## 2015-11-22 VITALS — BP 120/90 | HR 89 | Temp 97.9°F | Resp 16 | Ht 64.5 in | Wt 137.4 lb

## 2015-11-22 DIAGNOSIS — R634 Abnormal weight loss: Secondary | ICD-10-CM

## 2015-11-22 DIAGNOSIS — R7303 Prediabetes: Secondary | ICD-10-CM

## 2015-11-22 DIAGNOSIS — F909 Attention-deficit hyperactivity disorder, unspecified type: Secondary | ICD-10-CM | POA: Diagnosis not present

## 2015-11-22 DIAGNOSIS — E785 Hyperlipidemia, unspecified: Secondary | ICD-10-CM | POA: Diagnosis not present

## 2015-11-22 DIAGNOSIS — F17200 Nicotine dependence, unspecified, uncomplicated: Secondary | ICD-10-CM

## 2015-11-22 DIAGNOSIS — F411 Generalized anxiety disorder: Secondary | ICD-10-CM

## 2015-11-22 DIAGNOSIS — F988 Other specified behavioral and emotional disorders with onset usually occurring in childhood and adolescence: Secondary | ICD-10-CM

## 2015-11-22 DIAGNOSIS — E559 Vitamin D deficiency, unspecified: Secondary | ICD-10-CM | POA: Diagnosis not present

## 2015-11-22 DIAGNOSIS — L659 Nonscarring hair loss, unspecified: Secondary | ICD-10-CM

## 2015-11-22 DIAGNOSIS — K140 Glossitis: Secondary | ICD-10-CM

## 2015-11-22 DIAGNOSIS — Z79899 Other long term (current) drug therapy: Secondary | ICD-10-CM | POA: Diagnosis not present

## 2015-11-22 LAB — IRON AND TIBC
%SAT: 11 % (ref 11–50)
Iron: 47 ug/dL (ref 40–190)
TIBC: 418 ug/dL (ref 250–450)
UIBC: 371 ug/dL (ref 125–400)

## 2015-11-22 LAB — BASIC METABOLIC PANEL WITH GFR
BUN: 5 mg/dL — AB (ref 7–25)
CALCIUM: 9.3 mg/dL (ref 8.6–10.2)
CO2: 28 mmol/L (ref 20–31)
CREATININE: 0.52 mg/dL (ref 0.50–1.10)
Chloride: 101 mmol/L (ref 98–110)
GFR, Est African American: >89 mL/min (ref >=60)
GLUCOSE: 92 mg/dL (ref 65–99)
Potassium: 4 mmol/L (ref 3.5–5.3)
Sodium: 137 mmol/L (ref 135–146)

## 2015-11-22 LAB — HEPATIC FUNCTION PANEL
ALBUMIN: 4.6 g/dL (ref 3.6–5.1)
ALT: 7 U/L (ref 6–29)
AST: 20 U/L (ref 10–30)
Alkaline Phosphatase: 50 U/L (ref 33–115)
BILIRUBIN INDIRECT: 0.4 mg/dL (ref 0.2–1.2)
Bilirubin, Direct: 0.1 mg/dL (ref <=0.2)
TOTAL PROTEIN: 7.1 g/dL (ref 6.1–8.1)
Total Bilirubin: 0.5 mg/dL (ref 0.2–1.2)

## 2015-11-22 LAB — CBC WITH DIFFERENTIAL/PLATELET
BASOS ABS: 0 {cells}/uL (ref 0–200)
Basophils Relative: 0 %
EOS ABS: 74 {cells}/uL (ref 15–500)
Eosinophils Relative: 1 %
HEMATOCRIT: 44.7 % (ref 35.0–45.0)
HEMOGLOBIN: 14.5 g/dL (ref 11.7–15.5)
LYMPHS ABS: 2220 {cells}/uL (ref 850–3900)
LYMPHS PCT: 30 %
MCH: 28.1 pg (ref 27.0–33.0)
MCHC: 32.4 g/dL (ref 32.0–36.0)
MCV: 86.6 fL (ref 80.0–100.0)
MONO ABS: 666 {cells}/uL (ref 200–950)
MPV: 9.1 fL (ref 7.5–12.5)
Monocytes Relative: 9 %
NEUTROS PCT: 60 %
Neutro Abs: 4440 cells/uL (ref 1500–7800)
Platelets: 311 10*3/uL (ref 140–400)
RBC: 5.16 MIL/uL — ABNORMAL HIGH (ref 3.80–5.10)
RDW: 15.6 % — ABNORMAL HIGH (ref 11.0–15.0)
WBC: 7.4 10*3/uL (ref 3.8–10.8)

## 2015-11-22 LAB — LIPID PANEL
CHOLESTEROL: 205 mg/dL — AB (ref 125–200)
HDL: 92 mg/dL (ref >=46)
LDL CALC: 99 mg/dL (ref <130)
Total CHOL/HDL Ratio: 2.2 Ratio (ref <=5.0)
Triglycerides: 71 mg/dL (ref <150)
VLDL: 14 mg/dL (ref <30)

## 2015-11-22 LAB — CK: Total CK: 89 U/L (ref 7–177)

## 2015-11-22 LAB — HEMOGLOBIN A1C
Hgb A1c MFr Bld: 5.2 % (ref <5.7)
Mean Plasma Glucose: 103 mg/dL

## 2015-11-22 LAB — FERRITIN: FERRITIN: 8 ng/mL — AB (ref 10–232)

## 2015-11-22 LAB — HIV ANTIBODY (ROUTINE TESTING W REFLEX): HIV: NONREACTIVE

## 2015-11-22 LAB — MAGNESIUM: Magnesium: 1.9 mg/dL (ref 1.5–2.5)

## 2015-11-22 LAB — TSH: TSH: 0.81 mIU/L

## 2015-11-22 LAB — VITAMIN B12: VITAMIN B 12: 334 pg/mL (ref 200–1100)

## 2015-11-22 LAB — SEDIMENTATION RATE: Sed Rate: 1 mm/hr (ref 0–20)

## 2015-11-22 MED ORDER — FLUOXETINE HCL 10 MG PO CAPS: 10.0000 mg | ORAL_CAPSULE | Freq: Every day | ORAL | Status: DC

## 2015-11-22 MED ORDER — NYSTATIN 100000 UNIT/ML MT SUSP: 5.0000 mL | Freq: Four times a day (QID) | OROMUCOSAL | Status: DC

## 2015-11-22 NOTE — Patient Instructions (Addendum)
Over due to [facility]  7 a.m.-6:30 p.m., Monday 7 a.m.-5 p.m., Tuesday-Friday Schedule an appointment by calling [phone].  [facility] Schedule an appointment by calling [phone].  Encourage you to get the 3D Mammogram  The 3D Mammogram is much more specific and sensitive to pick up breast cancer. For women with fibrocystic breast or lumpy breast it can be hard to determine if it is cancer or not but the 3D mammogram is able to tell this difference which cuts back on unneeded additional tests or scary call backs.   - over 40% increase in detection of breast cancer - over 40% reduction in false positives.  - fewer call backs - reduced anxiety - improved outcomes - [facility]  Glossitis Glossitis is inflammation of the tongue. This may be a stand-alone condition or it may be a symptom of a different condition that you have. Generally, glossitis goes away when its cause is identified and treated. Glossitis can be dangerous if it causes difficulty breathing. CAUSES  This condition can be caused by many different things. In some cases, the cause may not be known. Certain underlying conditions may cause glossitis, such as:   Viral, bacterial, or yeast infections.  Allergies.  Dysfunction of the skin and mucous membranes. This can happen with certain autoimmune disorders.  Abnormal tissue growths (tumors).  Lack of healthy red blood cells (anemia).  Movement of stomach acid into the tube that connects the mouth and the stomach (gastroesophageal reflux).  Lack of proper nutrition or certain vitamins.  Certain lifelong (chronic) medical conditions, such as diabetes. Sometimes, glossitis may not be caused by an underlying condition. In these cases, glossitis may be caused by:   Use of tobacco products, such as cigarettes, chewing tobacco, or e-cigarettes.  Excessive alcohol use.  Tongue injury or irritation.  Certain  medicines, such as medicines to treat cancer. RISK FACTORS This condition is more likely to develop in:   People who are 50 years or older.  Men.  People who are taking antibiotics or steroids, such as asthma medicines.  People who drink alcohol excessively.  People who use tobacco products, including cigarettes, chewing tobacco, or e-cigarettes.  People with chronic medical conditions, such as immune diseases or cancer.  People who do not brush or floss their teeth regularly.  People who lack proper nutrition or are anemic. SYMPTOMS  Symptoms of this condition vary depending on the cause. Symptoms may include:   Swelling of the tongue.  Pain and tenderness in the tongue. Sometimes, this condition is painless.  Changes in tongue color. The tongue may be pale or bright red.  Smooth areas on the tongue's surface.  A small mass of tissue (node) or white patch on the tongue.  Difficulty chewing, swallowing, or talking.  Difficulty breathing. DIAGNOSIS  This condition is diagnosed based on a physical exam and medical history. Your health care provider may ask you about your eating and drinking habits. You may have tests, including:   Blood tests.   Removal of a small amount of cells from the tongue that are examined under a microscope (biopsy).  You may be given the name of a dentist or a health care provider who specializes in ear, nose, and throat (ENT) problems (otolaryngologist). TREATMENT  Treatment for this condition depends on the underlying cause, and may include:   Following instructions from your health care provider about keeping your mouth clean and avoiding irritants that may have caused  your condition or made it worse.  Nutritional therapy. Your health care provider may tell you to change your eating and drinking habits or take a nutritional supplement.  Managing underlying conditions that may have caused your glossitis.  Medicines, such  as:  Corticosteroids to reduce inflammation.  Antibiotics if your condition was caused by an infection.  Local anestheticsthat numb your tongue or mouth (local anesthetics). HOME CARE INSTRUCTIONS  Keep your teeth and mouth clean. This includes brushing and flossing frequently and having regular dental checkups.  If you wear dentures or dental braces, work with your dentist to make sure they fit correctly.  Eat healthy foods. Follow instructions from your health care provider about eating or drinking restrictions.  Avoid tobacco products, including cigarettes, chewing tobacco, or e-cigarettes. If you need help quitting, ask your health care provider.  Avoid excessive alcohol use.  Avoid any irritants that may have caused your condition or made it worse, such as chemicals or certain foods.  Keep all follow-up visits as told by your health care provider. This is important. SEEK MEDICAL CARE IF:  You have a fever.  You develop new symptoms.  You have symptoms that do not get better with medicine or get worse.  You have symptoms that do not go away after 10 days.  You cannot eat or drink because of your pain. SEEK IMMEDIATE MEDICAL CARE IF:   You have severe pain or swelling.  You have difficulty breathing, swallowing, or talking.   This information is not intended to replace advice given to you by your health care provider. Make sure you discuss any questions you have with your health care provider.   Document Released: 06/23/2002 Document Revised: 03/24/2015 Document Reviewed: 11/18/2014 Elsevier Interactive Patient Education [employer]2016 Elsevier Inc.
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
## 2015-11-23 LAB — VITAMIN D 25 HYDROXY (VIT D DEFICIENCY, FRACTURES): VIT D 25 HYDROXY: 35 ng/mL (ref 30–100)

## 2015-11-30 ENCOUNTER — Other Ambulatory Visit: Payer: Self-pay

## 2015-11-30 ENCOUNTER — Other Ambulatory Visit: Payer: Self-pay | Admitting: Physician Assistant

## 2015-11-30 DIAGNOSIS — Z1231 Encounter for screening mammogram for malignant neoplasm of breast: Secondary | ICD-10-CM

## 2015-11-30 DIAGNOSIS — F411 Generalized anxiety disorder: Secondary | ICD-10-CM

## 2015-11-30 MED ORDER — PANTOPRAZOLE SODIUM 40 MG PO TBEC: 40.0000 mg | DELAYED_RELEASE_TABLET | Freq: Every day | ORAL | Status: DC

## 2015-11-30 MED ORDER — ALPRAZOLAM 0.5 MG PO TABS: 0.5000 mg | ORAL_TABLET | Freq: Three times a day (TID) | ORAL | Status: DC | PRN

## 2015-11-30 NOTE — Telephone Encounter (Signed)
Refill xanax

## 2015-11-30 NOTE — Telephone Encounter (Signed)
RX CALLED INTO [facility]. 

## 2015-12-06 ENCOUNTER — Other Ambulatory Visit: Payer: Self-pay | Admitting: Internal Medicine

## 2015-12-06 MED ORDER — AMPHETAMINE-DEXTROAMPHETAMINE 20 MG PO TABS: 20.0000 mg | ORAL_TABLET | Freq: Two times a day (BID) | ORAL | Status: DC

## 2015-12-06 NOTE — Addendum Note (Signed)
Addended by: Quentin MullingOLLIER, Kejuan Bekker R on: 12/06/2015 01:56 PM   Modules accepted: Orders

## 2015-12-15 ENCOUNTER — Ambulatory Visit
Admission: RE | Admit: 2015-12-15 | Discharge: 2015-12-15 | Disposition: A | Discharge status: Home / Self Care | Payer: 59 | Source: Ambulatory Visit

## 2015-12-15 DIAGNOSIS — Z1231 Encounter for screening mammogram for malignant neoplasm of breast: Secondary | ICD-10-CM

## 2016-01-19 ENCOUNTER — Other Ambulatory Visit: Payer: Self-pay | Admitting: Physician Assistant

## 2016-01-19 DIAGNOSIS — F411 Generalized anxiety disorder: Secondary | ICD-10-CM

## 2016-01-19 MED ORDER — ALPRAZOLAM 0.5 MG PO TABS: 0.5000 mg | ORAL_TABLET | Freq: Three times a day (TID) | ORAL | Status: DC | PRN

## 2016-01-19 MED ORDER — AMPHETAMINE-DEXTROAMPHETAMINE 20 MG PO TABS: 20.0000 mg | ORAL_TABLET | Freq: Two times a day (BID) | ORAL | Status: DC

## 2016-01-19 NOTE — Addendum Note (Signed)
Addended by: Quentin MullingOLLIER, Geron Mulford R on: 01/19/2016 12:13 PM   Modules accepted: Orders

## 2016-02-15 ENCOUNTER — Other Ambulatory Visit: Payer: Self-pay | Admitting: Physician Assistant

## 2016-02-22 ENCOUNTER — Other Ambulatory Visit: Payer: Self-pay | Admitting: Physician Assistant

## 2016-02-22 MED ORDER — AMPHETAMINE-DEXTROAMPHETAMINE 20 MG PO TABS: 20.0000 mg | ORAL_TABLET | Freq: Two times a day (BID) | ORAL | 0 refills | Status: DC

## 2016-03-23 ENCOUNTER — Other Ambulatory Visit: Payer: Self-pay | Admitting: Physician Assistant

## 2016-03-23 DIAGNOSIS — F411 Generalized anxiety disorder: Secondary | ICD-10-CM

## 2016-03-23 MED ORDER — FLUOXETINE HCL 10 MG PO CAPS: 10.0000 mg | ORAL_CAPSULE | Freq: Every day | ORAL | 3 refills | Status: DC

## 2016-03-23 MED ORDER — ALPRAZOLAM 0.5 MG PO TABS: 0.5000 mg | ORAL_TABLET | Freq: Three times a day (TID) | ORAL | 0 refills | Status: DC | PRN

## 2016-03-23 NOTE — Telephone Encounter (Signed)
RX CALLED INTO [facility]. 

## 2016-03-24 ENCOUNTER — Other Ambulatory Visit: Payer: Self-pay | Admitting: Physician Assistant

## 2016-03-29 ENCOUNTER — Other Ambulatory Visit: Payer: Self-pay | Admitting: Physician Assistant

## 2016-03-29 MED ORDER — AMPHETAMINE-DEXTROAMPHETAMINE 20 MG PO TABS: 20.0000 mg | ORAL_TABLET | Freq: Two times a day (BID) | ORAL | 0 refills | Status: DC

## 2016-05-03 ENCOUNTER — Other Ambulatory Visit: Payer: Self-pay | Admitting: Physician Assistant

## 2016-05-03 MED ORDER — AMPHETAMINE-DEXTROAMPHETAMINE 20 MG PO TABS: 20.0000 mg | ORAL_TABLET | Freq: Two times a day (BID) | ORAL | 0 refills | Status: DC

## 2016-05-26 ENCOUNTER — Ambulatory Visit: Payer: Self-pay | Admitting: Physician Assistant

## 2016-05-31 ENCOUNTER — Ambulatory Visit: Payer: Self-pay | Admitting: Physician Assistant

## 2016-06-06 ENCOUNTER — Ambulatory Visit (INDEPENDENT_AMBULATORY_CARE_PROVIDER_SITE_OTHER): Payer: Managed Care, Other (non HMO) | Admitting: Physician Assistant

## 2016-06-06 VITALS — BP 148/90 | HR 88 | Temp 97.7°F | Resp 14 | Ht 64.0 in | Wt 148.0 lb

## 2016-06-06 DIAGNOSIS — F411 Generalized anxiety disorder: Secondary | ICD-10-CM | POA: Diagnosis not present

## 2016-06-06 DIAGNOSIS — E559 Vitamin D deficiency, unspecified: Secondary | ICD-10-CM | POA: Diagnosis not present

## 2016-06-06 DIAGNOSIS — F17200 Nicotine dependence, unspecified, uncomplicated: Secondary | ICD-10-CM

## 2016-06-06 DIAGNOSIS — F988 Other specified behavioral and emotional disorders with onset usually occurring in childhood and adolescence: Secondary | ICD-10-CM | POA: Diagnosis not present

## 2016-06-06 DIAGNOSIS — R7303 Prediabetes: Secondary | ICD-10-CM

## 2016-06-06 DIAGNOSIS — E785 Hyperlipidemia, unspecified: Secondary | ICD-10-CM

## 2016-06-06 MED ORDER — ALPRAZOLAM 0.5 MG PO TABS: 0.5000 mg | ORAL_TABLET | Freq: Three times a day (TID) | ORAL | 0 refills | Status: DC | PRN

## 2016-06-06 MED ORDER — ACYCLOVIR 400 MG PO TABS: ORAL_TABLET | ORAL | 1 refills | Status: DC

## 2016-06-06 MED ORDER — AMPHETAMINE-DEXTROAMPHETAMINE 20 MG PO TABS: 20.0000 mg | ORAL_TABLET | Freq: Two times a day (BID) | ORAL | 0 refills | Status: DC
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
## 2016-06-06 NOTE — Patient Instructions (Addendum)
Get CHEST XRAY  Will get CPE next OV 6 months   Food Choices for Gastroesophageal Reflux Disease, Adult When you have gastroesophageal reflux disease (GERD), the foods you eat and your eating habits are very important. Choosing the right foods can help ease your discomfort. What guidelines do I need to follow?  Choose fruits, vegetables, whole grains, and low-fat dairy products.  Choose low-fat meat, fish, and poultry.  Limit fats such as oils, salad dressings, butter, nuts, and avocado.  Keep a food diary. This helps you identify foods that cause symptoms.  Avoid foods that cause symptoms. These may be different for everyone.  Eat small meals often instead of 3 large meals a day.  Eat your meals slowly, in a place where you are relaxed.  Limit fried foods.  Cook foods using methods other than frying.  Avoid drinking alcohol.  Avoid drinking large amounts of liquids with your meals.  Avoid bending over or lying down until 2-3 hours after eating. What foods are not recommended? These are some foods and drinks that may make your symptoms worse: Vegetables  Tomatoes. Tomato juice. Tomato and spaghetti sauce. Chili peppers. Onion and garlic. Horseradish. Fruits  Oranges, grapefruit, and lemon (fruit and juice). Meats  High-fat meats, fish, and poultry. This includes hot dogs, ribs, ham, sausage, salami, and bacon. Dairy  Whole milk and chocolate milk. Sour cream. Cream. Butter. Ice cream. Cream cheese. Drinks  Coffee and tea. Bubbly (carbonated) drinks or energy drinks. Condiments  Hot sauce. Barbecue sauce. Sweets/Desserts  Chocolate and cocoa. Donuts. Peppermint and spearmint. Fats and Oils  High-fat foods. This includes [place] fries and potato chips. Other  Vinegar. Strong spices. This includes black pepper, white pepper, red pepper, cayenne, curry powder, cloves, ginger, and chili powder. The items listed above may not be a complete list of foods and drinks to  avoid. Contact your dietitian for more information.  This information is not intended to replace advice given to you by your health care provider. Make sure you discuss any questions you have with your health care provider. Document Released: 01/02/2012 Document Revised: 12/09/2015 Document Reviewed: 05/07/2013 Elsevier Interactive Patient Education  2017 [employer]

## 2016-07-16 ENCOUNTER — Other Ambulatory Visit: Payer: Self-pay | Admitting: Physician Assistant

## 2016-07-16 ENCOUNTER — Encounter: Payer: Self-pay | Admitting: Physician Assistant

## 2016-07-16 DIAGNOSIS — F411 Generalized anxiety disorder: Secondary | ICD-10-CM

## 2016-07-18 MED ORDER — AMPHETAMINE-DEXTROAMPHETAMINE 20 MG PO TABS: 20.0000 mg | ORAL_TABLET | Freq: Two times a day (BID) | ORAL | 0 refills | Status: DC

## 2016-07-18 MED ORDER — ALPRAZOLAM 0.5 MG PO TABS: 0.5000 mg | ORAL_TABLET | Freq: Three times a day (TID) | ORAL | 0 refills | Status: DC | PRN

## 2016-07-18 MED ORDER — PANTOPRAZOLE SODIUM 40 MG PO TBEC: 40.0000 mg | DELAYED_RELEASE_TABLET | Freq: Every day | ORAL | 1 refills | Status: DC

## 2016-08-10 ENCOUNTER — Other Ambulatory Visit: Payer: Self-pay | Admitting: Physician Assistant

## 2016-08-22 ENCOUNTER — Other Ambulatory Visit: Payer: Self-pay | Admitting: Physician Assistant

## 2016-08-23 ENCOUNTER — Encounter: Payer: Self-pay | Admitting: Physician Assistant

## 2016-08-23 ENCOUNTER — Other Ambulatory Visit: Payer: Self-pay | Admitting: Physician Assistant

## 2016-08-23 MED ORDER — AMPHETAMINE-DEXTROAMPHETAMINE 20 MG PO TABS: 20.0000 mg | ORAL_TABLET | Freq: Two times a day (BID) | ORAL | 0 refills | Status: DC

## 2016-08-23 NOTE — Telephone Encounter (Signed)
LVM informing pt that she could pick up Rx on Monday & to return office.

## 2016-09-05 ENCOUNTER — Encounter: Payer: Self-pay | Admitting: Physician Assistant

## 2016-09-08 ENCOUNTER — Ambulatory Visit (HOSPITAL_BASED_OUTPATIENT_CLINIC_OR_DEPARTMENT_OTHER)
Admission: RE | Admit: 2016-09-08 | Discharge: 2016-09-08 | Disposition: A | Discharge status: Home / Self Care | Payer: Managed Care, Other (non HMO) | Source: Ambulatory Visit | Attending: Physician Assistant | Admitting: Physician Assistant

## 2016-09-08 ENCOUNTER — Encounter: Payer: Self-pay | Admitting: Physician Assistant

## 2016-09-08 DIAGNOSIS — X58XXXA Exposure to other specified factors, initial encounter: Secondary | ICD-10-CM | POA: Insufficient documentation

## 2016-09-08 DIAGNOSIS — T7411XA Adult physical abuse, confirmed, initial encounter: Secondary | ICD-10-CM

## 2016-09-08 DIAGNOSIS — M940 Chondrocostal junction syndrome [Tietze]: Secondary | ICD-10-CM | POA: Insufficient documentation

## 2016-09-14 ENCOUNTER — Other Ambulatory Visit: Payer: Self-pay | Admitting: Physician Assistant

## 2016-09-14 DIAGNOSIS — F411 Generalized anxiety disorder: Secondary | ICD-10-CM

## 2016-09-14 MED ORDER — ALPRAZOLAM 0.5 MG PO TABS: 0.5000 mg | ORAL_TABLET | Freq: Three times a day (TID) | ORAL | 0 refills | Status: DC | PRN

## 2016-09-14 NOTE — Telephone Encounter (Signed)
XANAX WAS CALLED INTO PHARMACY ON 1ST MAR.18 @ 3:30PM BY DD

## 2016-09-26 ENCOUNTER — Other Ambulatory Visit: Payer: Self-pay | Admitting: Physician Assistant

## 2016-09-26 MED ORDER — AMPHETAMINE-DEXTROAMPHETAMINE 20 MG PO TABS: 20.0000 mg | ORAL_TABLET | Freq: Two times a day (BID) | ORAL | 0 refills | Status: DC

## 2016-10-17 ENCOUNTER — Other Ambulatory Visit: Payer: Self-pay | Admitting: Physician Assistant

## 2016-10-17 DIAGNOSIS — F411 Generalized anxiety disorder: Secondary | ICD-10-CM

## 2016-10-17 MED ORDER — ALPRAZOLAM 0.5 MG PO TABS: 0.5000 mg | ORAL_TABLET | Freq: Three times a day (TID) | ORAL | 0 refills | Status: DC | PRN

## 2016-10-17 NOTE — Telephone Encounter (Signed)
Xanax was called into pharmacy on 3rd April 2018 @ 12:11 pm by DD.

## 2016-11-01 ENCOUNTER — Other Ambulatory Visit: Payer: Self-pay | Admitting: Internal Medicine

## 2016-11-02 MED ORDER — AMPHETAMINE-DEXTROAMPHETAMINE 20 MG PO TABS: 20.0000 mg | ORAL_TABLET | Freq: Two times a day (BID) | ORAL | 0 refills | Status: DC

## 2016-11-14 ENCOUNTER — Other Ambulatory Visit: Payer: Self-pay | Admitting: Internal Medicine

## 2016-11-28 ENCOUNTER — Other Ambulatory Visit: Payer: Self-pay | Admitting: Physician Assistant

## 2016-12-04 ENCOUNTER — Encounter: Payer: Self-pay | Admitting: Physician Assistant

## 2016-12-07 ENCOUNTER — Other Ambulatory Visit: Payer: Self-pay | Admitting: Physician Assistant

## 2016-12-07 DIAGNOSIS — F411 Generalized anxiety disorder: Secondary | ICD-10-CM

## 2016-12-07 MED ORDER — ALPRAZOLAM 0.5 MG PO TABS: 0.5000 mg | ORAL_TABLET | Freq: Three times a day (TID) | ORAL | 0 refills | Status: DC | PRN

## 2016-12-07 MED ORDER — AMPHETAMINE-DEXTROAMPHETAMINE 20 MG PO TABS: 20.0000 mg | ORAL_TABLET | Freq: Two times a day (BID) | ORAL | 0 refills | Status: DC

## 2016-12-16 ENCOUNTER — Other Ambulatory Visit: Payer: Self-pay | Admitting: Physician Assistant

## 2016-12-16 DIAGNOSIS — F411 Generalized anxiety disorder: Secondary | ICD-10-CM

## 2016-12-18 NOTE — Telephone Encounter (Signed)
LVM for pt to return office call. 

## 2016-12-20 ENCOUNTER — Other Ambulatory Visit: Payer: Self-pay

## 2016-12-20 DIAGNOSIS — F411 Generalized anxiety disorder: Secondary | ICD-10-CM

## 2016-12-20 MED ORDER — ALPRAZOLAM 0.5 MG PO TABS: 0.5000 mg | ORAL_TABLET | Freq: Three times a day (TID) | ORAL | 0 refills | Status: DC | PRN

## 2017-01-22 ENCOUNTER — Encounter: Payer: Self-pay | Admitting: Physician Assistant

## 2017-01-24 ENCOUNTER — Encounter: Payer: Self-pay | Admitting: Physician Assistant

## 2017-01-24 ENCOUNTER — Ambulatory Visit (INDEPENDENT_AMBULATORY_CARE_PROVIDER_SITE_OTHER): Payer: Managed Care, Other (non HMO) | Admitting: Physician Assistant

## 2017-01-24 ENCOUNTER — Other Ambulatory Visit: Payer: Self-pay | Admitting: Physician Assistant

## 2017-01-24 VITALS — BP 118/76 | HR 93 | Temp 98.1°F | Resp 16 | Ht 64.0 in | Wt 149.8 lb

## 2017-01-24 DIAGNOSIS — R1030 Lower abdominal pain, unspecified: Secondary | ICD-10-CM | POA: Diagnosis not present

## 2017-01-24 DIAGNOSIS — R112 Nausea with vomiting, unspecified: Secondary | ICD-10-CM

## 2017-01-24 NOTE — Progress Notes (Signed)
Subjective:    Patient ID: Vanessa MessickNikki Greeley, female    DOB: Jun 03, 1971, 46 y.o.   MRN: [id number]  HPI 46 y.o. WF presents with nausea and vomiting x 2-3 months. Has had nausea with vomiting, vomiting worse in the morning, nausea after food. With chills and hot flashes, bloating after eating with burping. Some loose stools. No constipation, no blood in stool.  She has had extra stress with divorce and going to court, has stopped drinking alcohol x Sunday but states that before this she was drinking nightly due to stress, no NSAIDS.   No weight loss.  BMI is Body mass index is 25.71 kg/m., she is working on diet and exercise. Wt Readings from Last 3 Encounters:  01/24/17 149 lb 12.8 oz (67.9 kg)  06/06/16 148 lb (67.1 kg)  11/22/15 137 lb 6.4 oz (62.3 kg)   Patient's last menstrual period was 01/05/2017.  Medications Current Outpatient Prescriptions on File Prior to Visit  Medication Sig  . acyclovir (ZOVIRAX) 400 MG tablet Take 1 pill 3 times daily at start on cold sore  . ALPRAZolam (XANAX) 0.5 MG tablet Take 1 tablet (0.5 mg total) by mouth 3 (three) times daily as needed.  Marland Kitchen. amphetamine-dextroamphetamine (ADDERALL) 20 MG tablet Take 1 tablet (20 mg total) by mouth 2 (two) times daily. MDD: 2 tablets per day.  Marland Kitchen. FLUoxetine (PROZAC) 10 MG capsule TAKE ONE CAPSULE BY MOUTH ONCE DAILY  . pantoprazole (PROTONIX) 40 MG tablet TAKE ONE TABLET BY MOUTH ONCE DAILY   No current facility-administered medications on file prior to visit.     Problem list She has Depression; GERD (gastroesophageal reflux disease); Anemia; ADD (attention deficit disorder); Asthma; and Tobacco use disorder on her problem list.   Review of Systems  Constitutional: Positive for chills. Negative for activity change, appetite change, diaphoresis, fatigue and fever.  HENT: Negative.   Respiratory: Negative.  Negative for cough and shortness of breath.   Cardiovascular: Negative.  Negative for leg swelling.   Gastrointestinal: Positive for abdominal pain, diarrhea, nausea and vomiting. Negative for abdominal distention, anal bleeding, blood in stool, constipation and rectal pain.  Genitourinary: Negative.  Negative for flank pain and frequency.  Musculoskeletal: Negative for arthralgias, back pain, gait problem, joint swelling, myalgias, neck pain and neck stiffness.  Skin: Negative.  Negative for rash.  Neurological: Positive for dizziness. Negative for headaches.       Objective:   Physical Exam  Constitutional: She is oriented to person, place, and time. She appears well-developed and well-nourished.  HENT:  Head: Normocephalic and atraumatic.  Right Ear: External ear normal.  Left Ear: External ear normal.  Mouth/Throat: Oropharynx is clear and moist.  Eyes: Conjunctivae and EOM are normal. Pupils are equal, round, and reactive to light.  Neck: Normal range of motion. Neck supple. No thyromegaly present.  Cardiovascular: Normal rate, regular rhythm and normal heart sounds.  Exam reveals no gallop and no friction rub.   No murmur heard. Pulmonary/Chest: Effort normal and breath sounds normal. No respiratory distress. She has no wheezes.  Abdominal: Soft. Bowel sounds are normal. She exhibits distension. She exhibits no shifting dullness, no abdominal bruit, no pulsatile midline mass and no mass. There is no hepatosplenomegaly. There is tenderness in the epigastric area and left lower quadrant. There is guarding. There is no rigidity, no rebound, no CVA tenderness, no tenderness at McBurney's point and negative Murphy's sign. No hernia.  Musculoskeletal: Normal range of motion.  Lymphadenopathy:    She has no  cervical adenopathy.  Neurological: She is alert and oriented to person, place, and time.  Skin: Skin is warm and dry.  Psychiatric: She has a normal mood and affect.       Assessment & Plan:  1. Nausea and vomiting, intractability of vomiting not specified, unspecified vomiting  type Given zofran, phenerghan, cipro, flaygl and will get Ct AB  STOP ETOH, no NSAIDS, protonix 2 x a day Rule out diverticulitis, GERD appendicitis, PUD, etc If worse go to ER, may need referral GI - BASIC METABOLIC PANEL WITH GFR - CBC with Differential/Platelet - Hepatic function panel - Celiac Disease Comprehensive Panel with Reflexes - CT Abdomen Pelvis W Contrast; Future

## 2017-01-25 LAB — CELIAC DISEASE COMPREHENSIVE PANEL WITH REFLEXES
IgA: 182 mg/dL (ref 81–463)
TISSUE TRANSGLUTAMINASE AB, IGA: 1 U/mL (ref <4)

## 2017-01-25 LAB — HEPATIC FUNCTION PANEL
ALK PHOS: 55 U/L (ref 33–115)
ALT: 9 U/L (ref 6–29)
AST: 19 U/L (ref 10–35)
Albumin: 4.3 g/dL (ref 3.6–5.1)
BILIRUBIN DIRECT: 0.1 mg/dL (ref <=0.2)
BILIRUBIN INDIRECT: 0.2 mg/dL (ref 0.2–1.2)
BILIRUBIN TOTAL: 0.3 mg/dL (ref 0.2–1.2)
TOTAL PROTEIN: 6.7 g/dL (ref 6.1–8.1)

## 2017-01-25 LAB — CBC WITH DIFFERENTIAL/PLATELET
Basophils Absolute: 57 cells/uL (ref 0–200)
Basophils Relative: 1 %
Eosinophils Absolute: 57 cells/uL (ref 15–500)
Eosinophils Relative: 1 %
HEMATOCRIT: 40 % (ref 35.0–45.0)
HEMOGLOBIN: 12.6 g/dL (ref 11.7–15.5)
LYMPHS ABS: 1539 {cells}/uL (ref 850–3900)
Lymphocytes Relative: 27 %
MCH: 28.1 pg (ref 27.0–33.0)
MCHC: 31.5 g/dL — AB (ref 32.0–36.0)
MCV: 89.1 fL (ref 80.0–100.0)
MONO ABS: 627 {cells}/uL (ref 200–950)
MPV: 9.3 fL (ref 7.5–12.5)
Monocytes Relative: 11 %
NEUTROS PCT: 60 %
Neutro Abs: 3420 cells/uL (ref 1500–7800)
Platelets: 296 10*3/uL (ref 140–400)
RBC: 4.49 MIL/uL (ref 3.80–5.10)
RDW: 14.7 % (ref 11.0–15.0)
WBC: 5.7 10*3/uL (ref 3.8–10.8)

## 2017-01-25 LAB — BASIC METABOLIC PANEL WITH GFR
BUN: 7 mg/dL (ref 7–25)
CHLORIDE: 104 mmol/L (ref 98–110)
CO2: 24 mmol/L (ref 20–31)
Calcium: 9.4 mg/dL (ref 8.6–10.2)
Creat: 0.56 mg/dL (ref 0.50–1.10)
GFR, Est African American: >89 mL/min (ref >=60)
GFR, Est Non African American: >89 mL/min (ref >=60)
GLUCOSE: 102 mg/dL — AB (ref 65–99)
POTASSIUM: 3.7 mmol/L (ref 3.5–5.3)
Sodium: 141 mmol/L (ref 135–146)

## 2017-01-30 ENCOUNTER — Other Ambulatory Visit: Payer: Self-pay | Admitting: Physician Assistant

## 2017-01-31 MED ORDER — AMPHETAMINE-DEXTROAMPHETAMINE 20 MG PO TABS: 20.0000 mg | ORAL_TABLET | Freq: Two times a day (BID) | ORAL | 0 refills | Status: DC

## 2017-02-02 ENCOUNTER — Other Ambulatory Visit: Payer: Self-pay | Admitting: Physician Assistant

## 2017-02-02 MED ORDER — FLUCONAZOLE 150 MG PO TABS: 150.0000 mg | ORAL_TABLET | Freq: Every day | ORAL | 3 refills | Status: DC

## 2017-02-06 NOTE — Progress Notes (Signed)
Complete Physical  Assessment and Plan: Recurrent major depressive disorder, in partial remission (HCC) -  stress management techniques discussed, increase water, good sleep hygiene discussed, increase exercise, and increase veggies.  -     TSH  Tobacco use disorder Smoking cessation-  instruction/counseling given, counseled patient on the dangers of tobacco use, advised patient to stop smoking, and reviewed strategies to maximize success, patient not ready to quit at this time.   Anemia, unspecified type - monitor, continue iron supp with Vitamin C and increase green leafy veggies -     Iron and TIBC  Gastroesophageal reflux disease without esophagitis Continue PPI/H2 blocker, diet discussed  Uncomplicated asthma, unspecified asthma severity, unspecified whether persistent Avoid triggers, controlled at this time  Attention deficit disorder, unspecified hyperactivity presence -  Continue ADD medication, helps with focus, no AE's. The patient was counseled on the addictive nature of the medication and was encouraged to take drug holidays when not needed.   Medication management -     Magnesium  Hyperlipidemia, unspecified hyperlipidemia type -continue medications, check lipids, decrease fatty foods, increase activity.  -     Lipid panel  Vitamin D deficiency -     VITAMIN D 25 Hydroxy (Vit-D Deficiency, Fractures)  Encounter for general adult medical examination with abnormal findings Need to leave to get kids, will get PAP next OV  Need for diphtheria-tetanus-pertussis (Tdap) vaccine -     Tdap vaccine greater than or equal to 7yo IM  Lower abdominal pain Getting CT scan Friday -     Urinalysis, Routine w reflex microscopic -     Microalbumin / creatinine urine ratio   Discussed med's effects and SE's. Screening labs and tests as requested with regular follow-up as recommended. Over 40 minutes of exam, counseling, chart review, and complex, high level critical decision  making was performed this visit.   HPI  46 y.o. female  presents for a complete physical and follow up for has Depression; GERD (gastroesophageal reflux disease); Anemia; ADD (attention deficit disorder); Asthma; and Tobacco use disorder on her problem list..  Her blood pressure has been controlled at home, today their BP is BP: 120/66 She does not workout. She denies chest pain, shortness of breath, dizziness.  She has not been drinking due to GERD, states upper AB better, no nausea, vomiting, still some loose stools/diarrhea and LLQ pain but has improved, has CT scheduled for Friday at 9AM.  Patient is on an ADD medication, she states that the medication is helping and she denies any adverse reactions. Will take AS needed for work. She is not on cholesterol medication and denies myalgias. Her cholesterol is at goal. The cholesterol last visit was:   Lab Results  Component Value Date   CHOL 205 (H) 11/22/2015   HDL 92 11/22/2015   LDLCALC 99 11/22/2015   TRIG 71 11/22/2015   CHOLHDL 2.2 11/22/2015    Last A1C in the office was:  Lab Results  Component Value Date   HGBA1C 5.2 11/22/2015   Patient is on Vitamin D supplement.   Lab Results  Component Value Date   VD25OH 35 11/22/2015     BMI is Body mass index is 24.4 kg/m., she is working on diet and exercise. Wt Readings from Last 3 Encounters:  02/07/17 146 lb 9.6 oz (66.5 kg)  01/24/17 149 lb 12.8 oz (67.9 kg)  06/06/16 148 lb (67.1 kg)    Current Medications:  Current Outpatient Prescriptions on File Prior to Visit  Medication  Sig Dispense Refill  . acyclovir (ZOVIRAX) 400 MG tablet Take 1 pill 3 times daily at start on cold sore 50 tablet 1  . ALPRAZolam (XANAX) 0.5 MG tablet Take 1 tablet (0.5 mg total) by mouth 3 (three) times daily as needed. 90 tablet 0  . amphetamine-dextroamphetamine (ADDERALL) 20 MG tablet Take 1 tablet (20 mg total) by mouth 2 (two) times daily. MDD: 2 tablets per day. 60 tablet 0  . pantoprazole  (PROTONIX) 40 MG tablet TAKE ONE TABLET BY MOUTH ONCE DAILY 90 tablet 1   No current facility-administered medications on file prior to visit.    Allergies:  Allergies  Allergen Reactions  . Penicillins Nausea And Vomiting   Medical History:  She has Depression; GERD (gastroesophageal reflux disease); Anemia; ADD (attention deficit disorder); Asthma; and Tobacco use disorder on her problem list. Health Maintenance:   Immunization History  Administered Date(s) Administered  . Tdap 02/07/2017   Tetanus: 2018 Pneumovax: N/A Prevnar 13:  N/A Flu vaccine: N/A Zostavax: N/A  Patient's last menstrual period was 01/07/2017. Pap: DUE declines today MGM: 11/2015  CXR 2018 DEXA: Colonoscopy: EGD:  Patient Care Team: Vanessa Cowboy, MD as PCP - General (Internal Medicine)  Surgical History:  She has a past surgical history that includes External ear surgery; Inner ear surgery; Middle ear surgery; Bladder surgery; Cesarean section; and mastoid surgery. Family History:  Herfamily history includes Cancer in her mother; Diabetes in her father; Hypertension in her father. Social History:  She reports that she has been smoking.  She has been smoking about 1.00 pack per day. She has never used smokeless tobacco. She reports that she drinks alcohol. She reports that she does not use drugs.  Review of Systems: Review of Systems  Constitutional: Negative.   HENT: Positive for hearing loss.   Eyes: Negative.   Respiratory: Negative.   Cardiovascular: Negative.   Gastrointestinal: Positive for abdominal pain and diarrhea. Negative for blood in stool, constipation, heartburn, melena, nausea and vomiting.  Genitourinary: Negative.   Musculoskeletal: Negative.   Skin: Negative.   Neurological: Negative.   Psychiatric/Behavioral: Negative.     Physical Exam: Estimated body mass index is 24.4 kg/m as calculated from the following:   Height as of this encounter: 5\' 5"  (1.651 m).    Weight as of this encounter: 146 lb 9.6 oz (66.5 kg). BP 120/66   Pulse (!) 101   Temp 97.8 F (36.6 C)   Resp 18   Ht 5\' 5"  (1.651 m)   Wt 146 lb 9.6 oz (66.5 kg)   LMP 01/07/2017   SpO2 96%   BMI 24.40 kg/m  General Appearance: Well nourished, in no apparent distress.  Eyes: PERRLA, EOMs, conjunctiva no swelling or erythema, normal fundi and vessels.  Sinuses: No Frontal/maxillary tenderness  ENT/Mouth: Ext aud canals clear, normal light reflex with TMs without erythema, bulging. Good dentition. No erythema, swelling, or exudate on post pharynx. Tonsils not swollen or erythematous. Hearing normal.  Neck: Supple, thyroid normal. No bruits  Respiratory: Respiratory effort normal, BS equal bilaterally without rales, rhonchi, wheezing or stridor.  Cardio: RRR without murmurs, rubs or gallops. Brisk peripheral pulses without edema.  Chest: symmetric, with normal excursions and percussion.  Breasts: Symmetric, without lumps, nipple discharge, retractions.  Abdomen: Soft, nontender, no guarding, rebound, hernias, masses, or organomegaly.  Lymphatics: Non tender without lymphadenopathy.  Genitourinary: DEFER WILL GET NEXT OV needs to leave to get kids Musculoskeletal: Full ROM all peripheral extremities,5/5 strength, and normal gait.  Skin:  Warm, dry without rashes, lesions, ecchymosis. Neuro: Cranial nerves intact, reflexes equal bilaterally. Normal muscle tone, no cerebellar symptoms. Sensation intact.  Psych: Awake and oriented X 3, normal affect, Insight and Judgment appropriate.   EKG: defer AORTA SCAN: defer  Vanessa MullingAmanda Tala Lee 12:12 PM [facility]

## 2017-02-07 ENCOUNTER — Ambulatory Visit (INDEPENDENT_AMBULATORY_CARE_PROVIDER_SITE_OTHER): Payer: Managed Care, Other (non HMO) | Admitting: Physician Assistant

## 2017-02-07 ENCOUNTER — Encounter: Payer: Self-pay | Admitting: Physician Assistant

## 2017-02-07 ENCOUNTER — Other Ambulatory Visit: Payer: Self-pay

## 2017-02-07 VITALS — BP 120/66 | HR 101 | Temp 97.8°F | Resp 18 | Ht 65.0 in | Wt 146.6 lb

## 2017-02-07 DIAGNOSIS — E559 Vitamin D deficiency, unspecified: Secondary | ICD-10-CM

## 2017-02-07 DIAGNOSIS — Z0000 Encounter for general adult medical examination without abnormal findings: Secondary | ICD-10-CM | POA: Diagnosis not present

## 2017-02-07 DIAGNOSIS — Z23 Encounter for immunization: Secondary | ICD-10-CM

## 2017-02-07 DIAGNOSIS — E785 Hyperlipidemia, unspecified: Secondary | ICD-10-CM

## 2017-02-07 DIAGNOSIS — R7303 Prediabetes: Secondary | ICD-10-CM

## 2017-02-07 DIAGNOSIS — Z79899 Other long term (current) drug therapy: Secondary | ICD-10-CM

## 2017-02-07 DIAGNOSIS — F3341 Major depressive disorder, recurrent, in partial remission: Secondary | ICD-10-CM

## 2017-02-07 DIAGNOSIS — K219 Gastro-esophageal reflux disease without esophagitis: Secondary | ICD-10-CM

## 2017-02-07 DIAGNOSIS — F988 Other specified behavioral and emotional disorders with onset usually occurring in childhood and adolescence: Secondary | ICD-10-CM

## 2017-02-07 DIAGNOSIS — F17200 Nicotine dependence, unspecified, uncomplicated: Secondary | ICD-10-CM

## 2017-02-07 DIAGNOSIS — R1030 Lower abdominal pain, unspecified: Secondary | ICD-10-CM

## 2017-02-07 DIAGNOSIS — D649 Anemia, unspecified: Secondary | ICD-10-CM

## 2017-02-07 DIAGNOSIS — J45909 Unspecified asthma, uncomplicated: Secondary | ICD-10-CM

## 2017-02-07 DIAGNOSIS — Z0001 Encounter for general adult medical examination with abnormal findings: Secondary | ICD-10-CM

## 2017-02-07 LAB — IRON AND TIBC
%SAT: 28 % (ref 11–50)
IRON: 109 ug/dL (ref 40–190)
TIBC: 385 ug/dL (ref 250–450)
UIBC: 276 ug/dL

## 2017-02-07 LAB — LIPID PANEL
CHOLESTEROL: 215 mg/dL — AB (ref <200)
HDL: 65 mg/dL (ref >50)
LDL Cholesterol: 131 mg/dL — ABNORMAL HIGH (ref <100)
Total CHOL/HDL Ratio: 3.3 Ratio (ref <5.0)
Triglycerides: 94 mg/dL (ref <150)
VLDL: 19 mg/dL (ref <30)

## 2017-02-07 LAB — TSH: TSH: 1.11 m[IU]/L

## 2017-02-07 MED ORDER — FLUCONAZOLE 150 MG PO TABS: 150.0000 mg | ORAL_TABLET | Freq: Every day | ORAL | 3 refills | Status: DC

## 2017-02-08 LAB — URINALYSIS, ROUTINE W REFLEX MICROSCOPIC
BILIRUBIN URINE: NEGATIVE
GLUCOSE, UA: NEGATIVE
Hgb urine dipstick: NEGATIVE
Ketones, ur: NEGATIVE
LEUKOCYTES UA: NEGATIVE
Nitrite: NEGATIVE
PH: 7 (ref 5.0–8.0)
PROTEIN: NEGATIVE
SPECIFIC GRAVITY, URINE: 1.008 (ref 1.001–1.035)

## 2017-02-08 LAB — VITAMIN D 25 HYDROXY (VIT D DEFICIENCY, FRACTURES): VIT D 25 HYDROXY: 34 ng/mL (ref 30–100)

## 2017-02-08 LAB — MICROALBUMIN / CREATININE URINE RATIO
Creatinine, Urine: 43 mg/dL (ref 20–320)
MICROALB/CREAT RATIO: 7 ug/mg{creat} (ref <30)
Microalb, Ur: 0.3 mg/dL

## 2017-02-08 LAB — MAGNESIUM: MAGNESIUM: 2 mg/dL (ref 1.5–2.5)

## 2017-02-09 ENCOUNTER — Ambulatory Visit
Admission: RE | Admit: 2017-02-09 | Discharge: 2017-02-09 | Disposition: A | Discharge status: Home / Self Care | Payer: Managed Care, Other (non HMO) | Source: Ambulatory Visit | Attending: Physician Assistant | Admitting: Physician Assistant

## 2017-02-09 DIAGNOSIS — R112 Nausea with vomiting, unspecified: Secondary | ICD-10-CM

## 2017-02-09 DIAGNOSIS — R1030 Lower abdominal pain, unspecified: Secondary | ICD-10-CM

## 2017-02-09 MED ORDER — IOPAMIDOL (ISOVUE-300) INJECTION 61%
100.0000 mL | Freq: Once | INTRAVENOUS | Status: AC | PRN
  Administered 2017-02-09: 100 mL via INTRAVENOUS

## 2017-03-08 ENCOUNTER — Other Ambulatory Visit: Payer: Self-pay | Admitting: Physician Assistant

## 2017-03-08 MED ORDER — AMPHETAMINE-DEXTROAMPHETAMINE 20 MG PO TABS: 20.0000 mg | ORAL_TABLET | Freq: Two times a day (BID) | ORAL | 0 refills | Status: DC

## 2017-04-02 ENCOUNTER — Other Ambulatory Visit: Payer: Self-pay | Admitting: Physician Assistant

## 2017-04-02 DIAGNOSIS — F411 Generalized anxiety disorder: Secondary | ICD-10-CM

## 2017-04-03 NOTE — Telephone Encounter (Signed)
Xanax called into pharmacy on 18th Sept 2018 by DD  

## 2017-04-17 ENCOUNTER — Encounter: Payer: Self-pay | Admitting: Physician Assistant

## 2017-04-17 ENCOUNTER — Other Ambulatory Visit: Payer: Self-pay | Admitting: Physician Assistant

## 2017-04-17 DIAGNOSIS — F411 Generalized anxiety disorder: Secondary | ICD-10-CM

## 2017-04-17 MED ORDER — AMPHETAMINE-DEXTROAMPHETAMINE 20 MG PO TABS: 20.0000 mg | ORAL_TABLET | Freq: Two times a day (BID) | ORAL | 0 refills | Status: DC

## 2017-05-17 ENCOUNTER — Other Ambulatory Visit: Payer: Self-pay | Admitting: Physician Assistant

## 2017-05-17 MED ORDER — AMPHETAMINE-DEXTROAMPHETAMINE 20 MG PO TABS: 20.0000 mg | ORAL_TABLET | Freq: Two times a day (BID) | ORAL | 0 refills | Status: DC

## 2017-06-27 ENCOUNTER — Other Ambulatory Visit: Payer: Self-pay | Admitting: Physician Assistant

## 2017-06-27 ENCOUNTER — Encounter: Payer: Self-pay | Admitting: Physician Assistant

## 2017-06-27 DIAGNOSIS — F411 Generalized anxiety disorder: Secondary | ICD-10-CM

## 2017-06-28 MED ORDER — AMPHETAMINE-DEXTROAMPHETAMINE 20 MG PO TABS: 20.0000 mg | ORAL_TABLET | Freq: Two times a day (BID) | ORAL | 0 refills | Status: DC

## 2017-06-28 MED ORDER — ALPRAZOLAM 0.5 MG PO TABS: 0.5000 mg | ORAL_TABLET | Freq: Two times a day (BID) | ORAL | 0 refills | Status: DC | PRN

## 2017-08-08 NOTE — Progress Notes (Signed)
FOLLOW UP  WILL GET PAP NEXT TIME  Assessment and Plan: Recurrent major depressive disorder, in partial remission (HCC) -  stress management techniques discussed, increase water, good sleep hygiene discussed, increase exercise, and increase veggies.   Tobacco use disorder Smoking cessation-  instruction/counseling given, counseled patient on the dangers of tobacco use, advised patient to stop smoking, and reviewed strategies to maximize success, patient not ready to quit at this time.   Anemia, unspecified type - monitor, continue iron supp with Vitamin C and increase green leafy veggies  Gastroesophageal reflux disease without esophagitis Continue PPI/H2 blocker, diet discussed  Uncomplicated asthma, unspecified asthma severity, unspecified whether persistent Avoid triggers, controlled at this time  Attention deficit disorder, unspecified hyperactivity presence -  Continue ADD medication, helps with focus, no AE's. The patient was counseled on the addictive nature of the medication and was encouraged to take drug holidays when not needed.  - refill  Hyperlipidemia, unspecified hyperlipidemia type -continue medications, check lipids, decrease fatty foods, increase activity.    Discussed med's effects and SE's. Screening labs and tests as requested with regular follow-up as recommended. Over 40 minutes of exam, counseling, chart review, and complex, high level critical decision making was performed this visit.   HPI  47 y.o. female  presents for a follow up for has Depression; GERD (gastroesophageal reflux disease); Anemia; ADD (attention deficit disorder); Asthma; and Tobacco use disorder on their problem list..  Her blood pressure has been controlled at home, today their BP is BP: 134/80 She does not workout. She denies chest pain, shortness of breath, dizziness.  She has history of GERD, normal CT AB 01/2017, has decreased ETOH and started vitamin D that she states has helped.   Patient is on an ADD medication, she states that the medication is helping and she denies any adverse reactions. Will take AS needed for work. She is taking the xanax AS NEEDED and she is using CBD gummies for anxiety. She has a lot of stress with her exhusband and her kids, very difficult time with her son drew and carmeron.  She is not on cholesterol medication and denies myalgias. Her cholesterol is at goal. The cholesterol last visit was:   Lab Results  Component Value Date   CHOL 215 (H) 02/07/2017   HDL 65 02/07/2017   LDLCALC 131 (H) 02/07/2017   TRIG 94 02/07/2017   CHOLHDL 3.3 02/07/2017    Last A1C in the office was:  Lab Results  Component Value Date   HGBA1C 5.2 11/22/2015   Patient is on Vitamin D supplement.   Lab Results  Component Value Date   VD25OH 34 02/07/2017     BMI is Body mass index is 24.36 kg/m., she is working on diet and exercise. Wt Readings from Last 3 Encounters:  08/10/17 146 lb 6.4 oz (66.4 kg)  02/07/17 146 lb 9.6 oz (66.5 kg)  01/24/17 149 lb 12.8 oz (67.9 kg)    Current Medications:  Current Outpatient Medications on File Prior to Visit  Medication Sig Dispense Refill  . acyclovir (ZOVIRAX) 400 MG tablet Take 1 pill 3 times daily at start on cold sore 50 tablet 1  . ALPRAZolam (XANAX) 0.5 MG tablet Take 1 tablet (0.5 mg total) by mouth 2 (two) times daily as needed. 60 tablet 0  . amphetamine-dextroamphetamine (ADDERALL) 20 MG tablet Take 1 tablet (20 mg total) by mouth 2 (two) times daily. MDD: 2 tablets per day. 60 tablet 0  . pantoprazole (PROTONIX) 40 MG  tablet TAKE ONE TABLET BY MOUTH ONCE DAILY 90 tablet 1   No current facility-administered medications on file prior to visit.    Allergies:  Allergies  Allergen Reactions  . Penicillins Nausea And Vomiting   Medical History:  She has Depression; GERD (gastroesophageal reflux disease); Anemia; ADD (attention deficit disorder); Asthma; and Tobacco use disorder on their problem  list.  Surgical History: reviewed and unchanged Family History: reviewed and unchanged Social History: reviewed and unchanged  Review of Systems: Review of Systems  Constitutional: Negative.   HENT: Positive for hearing loss.   Eyes: Negative.   Respiratory: Negative.   Cardiovascular: Negative.   Gastrointestinal: Negative for abdominal pain, blood in stool, constipation, diarrhea, heartburn, melena, nausea and vomiting.  Genitourinary: Negative.   Musculoskeletal: Negative.   Skin: Negative.   Neurological: Negative.   Psychiatric/Behavioral: Negative.     Physical Exam: Estimated body mass index is 24.36 kg/m as calculated from the following:   Height as of this encounter: 5\' 5"  (1.651 m).   Weight as of this encounter: 146 lb 6.4 oz (66.4 kg). BP 134/80   Pulse 88   Temp 97.7 F (36.5 C)   Resp 14   Ht 5\' 5"  (1.651 m)   Wt 146 lb 6.4 oz (66.4 kg)   LMP 07/27/2017   SpO2 98%   BMI 24.36 kg/m  General Appearance: Well nourished, in no apparent distress.  Eyes: PERRLA, EOMs, conjunctiva no swelling or erythema, normal fundi and vessels.  Sinuses: No Frontal/maxillary tenderness  ENT/Mouth: Ext aud canals clear, normal light reflex with TMs without erythema, bulging. Good dentition. No erythema, swelling, or exudate on post pharynx. Tonsils not swollen or erythematous. Hearing normal.  Neck: Supple, thyroid normal. No bruits  Respiratory: Respiratory effort normal, BS equal bilaterally without rales, rhonchi, wheezing or stridor.  Cardio: RRR without murmurs, rubs or gallops. Brisk peripheral pulses without edema.  Chest: symmetric, with normal excursions and percussion.  Breasts: Symmetric, without lumps, nipple discharge, retractions.  Abdomen: Soft, nontender, no guarding, rebound, hernias, masses, or organomegaly.  Lymphatics: Non tender without lymphadenopathy.  Musculoskeletal: Full ROM all peripheral extremities,5/5 strength, and normal gait.  Skin: Warm, dry  without rashes, lesions, ecchymosis. Neuro: Cranial nerves intact, reflexes equal bilaterally. Normal muscle tone, no cerebellar symptoms. Sensation intact.  Psych: Awake and oriented X 3, normal affect, Insight and Judgment appropriate.    Quentin MullingAmanda Essex Perry 9:02 AM [facility]

## 2017-08-10 ENCOUNTER — Ambulatory Visit: Payer: BLUE CROSS/BLUE SHIELD | Admitting: Physician Assistant

## 2017-08-10 ENCOUNTER — Encounter: Payer: Self-pay | Admitting: Physician Assistant

## 2017-08-10 VITALS — BP 134/80 | HR 88 | Temp 97.7°F | Resp 14 | Ht 65.0 in | Wt 146.4 lb

## 2017-08-10 DIAGNOSIS — K219 Gastro-esophageal reflux disease without esophagitis: Secondary | ICD-10-CM | POA: Diagnosis not present

## 2017-08-10 DIAGNOSIS — D649 Anemia, unspecified: Secondary | ICD-10-CM | POA: Diagnosis not present

## 2017-08-10 DIAGNOSIS — F17200 Nicotine dependence, unspecified, uncomplicated: Secondary | ICD-10-CM | POA: Diagnosis not present

## 2017-08-10 DIAGNOSIS — J45909 Unspecified asthma, uncomplicated: Secondary | ICD-10-CM | POA: Diagnosis not present

## 2017-08-10 DIAGNOSIS — F3341 Major depressive disorder, recurrent, in partial remission: Secondary | ICD-10-CM | POA: Diagnosis not present

## 2017-08-10 MED ORDER — AMPHETAMINE-DEXTROAMPHETAMINE 20 MG PO TABS: 20.0000 mg | ORAL_TABLET | Freq: Two times a day (BID) | ORAL | 0 refills | Status: DC

## 2017-09-14 ENCOUNTER — Other Ambulatory Visit: Payer: Self-pay | Admitting: Physician Assistant

## 2017-09-14 DIAGNOSIS — F411 Generalized anxiety disorder: Secondary | ICD-10-CM

## 2017-09-14 MED ORDER — AMPHETAMINE-DEXTROAMPHETAMINE 20 MG PO TABS: 20.0000 mg | ORAL_TABLET | Freq: Two times a day (BID) | ORAL | 0 refills | Status: DC

## 2017-09-14 MED ORDER — ALPRAZOLAM 0.5 MG PO TABS: 0.5000 mg | ORAL_TABLET | Freq: Two times a day (BID) | ORAL | 0 refills | Status: DC | PRN

## 2017-10-03 DIAGNOSIS — F330 Major depressive disorder, recurrent, mild: Secondary | ICD-10-CM | POA: Diagnosis not present

## 2017-10-03 DIAGNOSIS — F1021 Alcohol dependence, in remission: Secondary | ICD-10-CM | POA: Diagnosis not present
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
## 2017-10-19 ENCOUNTER — Other Ambulatory Visit: Payer: Self-pay | Admitting: Physician Assistant

## 2017-10-19 MED ORDER — AMPHETAMINE-DEXTROAMPHETAMINE 20 MG PO TABS: 20.0000 mg | ORAL_TABLET | Freq: Two times a day (BID) | ORAL | 0 refills | Status: DC

## 2017-11-14 ENCOUNTER — Ambulatory Visit: Payer: Self-pay | Admitting: Adult Health

## 2017-11-22 ENCOUNTER — Other Ambulatory Visit: Payer: Self-pay | Admitting: Physician Assistant

## 2017-11-22 MED ORDER — AMPHETAMINE-DEXTROAMPHETAMINE 20 MG PO TABS: 20.0000 mg | ORAL_TABLET | Freq: Two times a day (BID) | ORAL | 0 refills | Status: DC

## 2017-12-04 ENCOUNTER — Encounter: Payer: Self-pay | Admitting: Physician Assistant

## 2017-12-06 DIAGNOSIS — T148XXA Other injury of unspecified body region, initial encounter: Secondary | ICD-10-CM | POA: Diagnosis not present

## 2017-12-06 DIAGNOSIS — M19071 Primary osteoarthritis, right ankle and foot: Secondary | ICD-10-CM | POA: Diagnosis not present

## 2017-12-26 ENCOUNTER — Other Ambulatory Visit: Payer: Self-pay | Admitting: Physician Assistant

## 2017-12-26 MED ORDER — AMPHETAMINE-DEXTROAMPHETAMINE 20 MG PO TABS: ORAL_TABLET | ORAL | 0 refills | Status: DC

## 2018-01-26 ENCOUNTER — Other Ambulatory Visit: Payer: Self-pay | Admitting: Physician Assistant

## 2018-01-26 ENCOUNTER — Other Ambulatory Visit: Payer: Self-pay | Admitting: Internal Medicine

## 2018-01-26 DIAGNOSIS — F411 Generalized anxiety disorder: Secondary | ICD-10-CM

## 2018-01-26 MED ORDER — AMPHETAMINE-DEXTROAMPHETAMINE 20 MG PO TABS: ORAL_TABLET | ORAL | 0 refills | Status: DC

## 2018-01-27 MED ORDER — ALPRAZOLAM 0.5 MG PO TABS: 0.5000 mg | ORAL_TABLET | Freq: Two times a day (BID) | ORAL | 0 refills | Status: DC | PRN

## 2018-02-07 DIAGNOSIS — E785 Hyperlipidemia, unspecified: Secondary | ICD-10-CM | POA: Insufficient documentation

## 2018-02-07 NOTE — Progress Notes (Deleted)
Complete Physical  Assessment and Plan:  Encounter for general adult medical examination with abnormal findings  Recurrent major depressive disorder, in partial remission (HCC) -  stress management techniques discussed, increase water, good sleep hygiene discussed, increase exercise, and increase veggies.   Tobacco use disorder Smoking cessation-  instruction/counseling given, counseled patient on the dangers of tobacco use, advised patient to stop smoking, and reviewed strategies to maximize success, patient not ready to quit at this time.   Anemia, unspecified type - monitor, continue iron supp with Vitamin C and increase green leafy veggies  Gastroesophageal reflux disease without esophagitis Continue PPI/H2 blocker, diet discussed  Uncomplicated asthma, unspecified asthma severity, unspecified whether persistent Avoid triggers, controlled at this time  Attention deficit disorder, unspecified hyperactivity presence -  Continue ADD medication, helps with focus, no AE's. The patient was counseled on the addictive nature of the medication and was encouraged to take drug holidays when not needed.   Medication management -     Magnesium  Hyperlipidemia, unspecified hyperlipidemia type -continue medications, check lipids, decrease fatty foods, increase activity.  -     Lipid panel  Vitamin D deficiency -     VITAMIN D 25 Hydroxy (Vit-D Deficiency, Fractures)  No orders of the defined types were placed in this encounter.     Discussed med's effects and SE's. Screening labs and tests as requested with regular follow-up as recommended. Over 40 minutes of exam, counseling, chart review, and complex, high level critical decision making was performed this visit.   Future Appointments  Date Time Provider Department Center  02/08/2018  9:30 AM Judd Gaudier, NP [facility] None  02/14/2019  9:30 AM Judd Gaudier, NP [facility] None     HPI  47 y.o. female  presents for a  complete physical and follow up for has Depression; GERD (gastroesophageal reflux disease); Anemia; ADD (attention deficit disorder); Asthma; and Tobacco use disorder on their problem list.   Patient is on an ADD medication (adderall 20 mg ***), she states that the medication is helping and she denies any adverse reactions.   BMI is There is no height or weight on file to calculate BMI., she {HAS HAS [id number]} been working on diet and exercise. Wt Readings from Last 3 Encounters:  08/10/17 146 lb 6.4 oz (66.4 kg)  02/07/17 146 lb 9.6 oz (66.5 kg)  01/24/17 149 lb 12.8 oz (67.9 kg)   Her blood pressure has been controlled at home, today their BP is   She does not workout. She denies chest pain, shortness of breath, dizziness.   She is not on cholesterol medication and denies myalgias. Her cholesterol is at goal. The cholesterol last visit was:   Lab Results  Component Value Date   CHOL 215 (H) 02/07/2017   HDL 65 02/07/2017   LDLCALC 131 (H) 02/07/2017   TRIG 94 02/07/2017   CHOLHDL 3.3 02/07/2017    Last A1C in the office was:  Lab Results  Component Value Date   HGBA1C 5.2 11/22/2015   Patient is on Vitamin D supplement.   Lab Results  Component Value Date   VD25OH 34 02/07/2017       Current Medications:  Current Outpatient Medications on File Prior to Visit  Medication Sig Dispense Refill  . acyclovir (ZOVIRAX) 400 MG tablet Take 1 pill 3 times daily at start on cold sore 50 tablet 1  . ALPRAZolam (XANAX) 0.5 MG tablet Take 1 tablet (0.5 mg total) by mouth 2 (two) times daily as needed.  60 tablet 0  . amphetamine-dextroamphetamine (ADDERALL) 20 MG tablet Take 1/2-1 tablet 1-2 x /day ONLY as needed for ADD  & please try to limit to 5 days /week to avoid addiction 50 tablet 0  . pantoprazole (PROTONIX) 40 MG tablet TAKE ONE TABLET BY MOUTH ONCE DAILY 90 tablet 1   No current facility-administered medications on file prior to visit.    Allergies:  Allergies  Allergen  Reactions  . Penicillins Nausea And Vomiting   Medical History:  She has Depression; GERD (gastroesophageal reflux disease); Anemia; ADD (attention deficit disorder); Asthma; and Tobacco use disorder on their problem list. Health Maintenance:   Immunization History  Administered Date(s) Administered  . Tdap 02/07/2017   Tetanus: 2018 Pneumovax: N/A Prevnar 13:  N/A Flu vaccine: N/A Zostavax: N/A  No LMP recorded. Pap: DUE *** MGM: 11/2015  *** CXR 2018 DEXA: Colonoscopy: EGD:  Patient Care Team: Lucky Cowboy, MD as PCP - General (Internal Medicine)  Surgical History:  She has a past surgical history that includes External ear surgery; Inner ear surgery; Middle ear surgery; Bladder surgery; Cesarean section; and mastoid surgery. Family History:  Herfamily history includes Cancer in her mother; Diabetes in her father; Hypertension in her father. Social History:  She reports that she has quit smoking. She smoked 1.00 pack per day. She uses smokeless tobacco. She reports that she drinks alcohol. She reports that she does not use drugs.  Review of Systems: Review of Systems  Constitutional: Negative.  Negative for malaise/fatigue and weight loss.  HENT: Negative for hearing loss and tinnitus.   Eyes: Negative.  Negative for blurred vision and double vision.  Respiratory: Negative.  Negative for cough, sputum production, shortness of breath and wheezing.   Cardiovascular: Negative.  Negative for chest pain, palpitations, orthopnea, claudication, leg swelling and PND.  Gastrointestinal: Negative for abdominal pain, blood in stool, constipation, diarrhea, heartburn, melena, nausea and vomiting.  Genitourinary: Negative.   Musculoskeletal: Negative.  Negative for joint pain and myalgias.  Skin: Negative.  Negative for rash.  Neurological: Negative.  Negative for dizziness, tingling, sensory change, weakness and headaches.  Endo/Heme/Allergies: Negative for polydipsia.   Psychiatric/Behavioral: Negative.  Negative for depression, memory loss, substance abuse and suicidal ideas. The patient is not nervous/anxious and does not have insomnia.   All other systems reviewed and are negative.   Physical Exam: Estimated body mass index is 24.36 kg/m as calculated from the following:   Height as of 08/10/17: 5\' 5"  (1.651 m).   Weight as of 08/10/17: 146 lb 6.4 oz (66.4 kg). There were no vitals taken for this visit. General Appearance: Well nourished, in no apparent distress.  Eyes: PERRLA, EOMs, conjunctiva no swelling or erythema, normal fundi and vessels.  Sinuses: No Frontal/maxillary tenderness  ENT/Mouth: Ext aud canals clear, normal light reflex with TMs without erythema, bulging. Good dentition. No erythema, swelling, or exudate on post pharynx. Tonsils not swollen or erythematous. Hearing normal.  Neck: Supple, thyroid normal. No bruits  Respiratory: Respiratory effort normal, BS equal bilaterally without rales, rhonchi, wheezing or stridor.  Cardio: RRR without murmurs, rubs or gallops. Brisk peripheral pulses without edema.  Chest: symmetric, with normal excursions and percussion.  Breasts: Symmetric, without lumps, nipple discharge, retractions.  Abdomen: Soft, nontender, no guarding, rebound, hernias, masses, or organomegaly.  Lymphatics: Non tender without lymphadenopathy.  Genitourinary: *** Musculoskeletal: Full ROM all peripheral extremities,5/5 strength, and normal gait.  Skin: Warm, dry without rashes, lesions, ecchymosis. Neuro: Cranial nerves intact, reflexes equal bilaterally. Normal  muscle tone, no cerebellar symptoms. Sensation intact.  Psych: Awake and oriented X 3, normal affect, Insight and Judgment appropriate.   EKG: defer AORTA SCAN: defer  Carlyon ShadowAshley C Ysenia Filice 1:22 PM [facility]

## 2018-02-08 ENCOUNTER — Encounter: Payer: Self-pay | Admitting: Physician Assistant

## 2018-02-08 ENCOUNTER — Encounter: Payer: Self-pay | Admitting: Adult Health

## 2018-02-27 ENCOUNTER — Other Ambulatory Visit: Payer: Self-pay

## 2018-02-27 MED ORDER — AMPHETAMINE-DEXTROAMPHETAMINE 20 MG PO TABS: ORAL_TABLET | ORAL | 0 refills | Status: DC

## 2018-03-13 ENCOUNTER — Encounter: Payer: Self-pay | Admitting: Adult Health

## 2018-03-20 NOTE — Progress Notes (Signed)
Complete Physical  Assessment and Plan:  Encounter for general adult medical examination without abnormal findings - reminded to schedule mammogram  Recurrent major depressive disorder, in partial remission (HCC) -  stress management techniques discussed, increase water, good sleep hygiene discussed, increase exercise, and increase veggies.  -     TSH  Tobacco use disorder Smoking cessation-  instruction/counseling given, counseled patient on the dangers of tobacco use, advised patient to stop smoking, and reviewed strategies to maximize success, patient not ready to quit at this time.   Gastroesophageal reflux disease without esophagitis Continue PPI/H2 blocker, diet discussed  Uncomplicated asthma, unspecified asthma severity, unspecified whether persistent Avoid triggers, controlled at this time  Attention deficit disorder, unspecified hyperactivity presence -  Continue ADD medication, helps with focus, no AE's. The patient was counseled on the addictive nature of the medication and was encouraged to take drug holidays when not needed.   Medication management -     Magnesium  Hyperlipidemia, unspecified hyperlipidemia type -continue medications, check lipids, decrease fatty foods, increase activity.  -     Lipid panel  Vitamin D deficiency -     VITAMIN D 25 Hydroxy (Vit-D Deficiency, Fractures)  Orders Placed This Encounter  Procedures  . CBC with Differential/Platelet  . COMPLETE METABOLIC PANEL WITH GFR  . Magnesium  . Lipid panel  . TSH  . Hemoglobin A1c  . VITAMIN D 25 Hydroxy (Vit-D Deficiency, Fractures)  . Urinalysis w microscopic + reflex cultur  . Microalbumin / creatinine urine ratio  . EKG 12-Lead     Discussed med's effects and SE's. Screening labs and tests as requested with regular follow-up as recommended. Over 40 minutes of exam, counseling, chart review, and complex, high level critical decision making was performed this visit.   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  She has had a stressful year in court fighting with her ex.   she has a diagnosis of anxiety and is currently on PRN xnaax, reports symptoms are well controlled on current regimen. she currently uses rarely, typically when in court, etc. Averages 0-2 tabs.   Patient is on an ADD medication, she states that the medication is helping and she denies any adverse reactions. She currently takes 1-2 tabs on days that she works.   she currently vapes daily; discussed risks associated with smoking, patient is not ready to quit.   BMI is Body mass index is 23.83 kg/m., she has been working on diet and exercise. Walks at least 10000 steps daily.  Wt Readings from Last 3 Encounters:  03/22/18 141 lb (64 kg)  08/10/17 146 lb 6.4 oz (66.4 kg)  02/07/17 146 lb 9.6 oz (66.5 kg)   Her blood pressure has been controlled at home, today their BP is BP: 138/88 She does not workout. She denies chest pain, shortness of breath, dizziness.   she has a diagnosis of GERD which is currently managed by pantoprazole 40 mg every 2-3 days.  she reports symptoms is currently well controlled, and denies breakthrough reflux, burning in chest, hoarseness or cough.    She is not on cholesterol medication and denies myalgias. Her cholesterol is at goal. The  cholesterol last visit was:   Lab Results  Component Value Date   CHOL 215 (H) 02/07/2017   HDL 65 02/07/2017   LDLCALC 131 (H) 02/07/2017   TRIG 94 02/07/2017   CHOLHDL 3.3 02/07/2017    Last A1C in the office was:  Lab Results  Component Value Date   HGBA1C 5.2  11/22/2015   Patient is currently on 5000 IU Vitamin D supplement.   Lab Results  Component Value Date   VD25OH 34 02/07/2017     Lab Results  Component Value Date   GFRNONAA >89 01/24/2017     Current Medications:  Current Outpatient Medications on File Prior to Visit  Medication Sig Dispense Refill  . acyclovir (ZOVIRAX) 400 MG tablet Take 1 pill 3 times daily at start on cold sore 50 tablet 1  . ALPRAZolam (XANAX) 0.5 MG tablet Take 1 tablet (0.5 mg total) by mouth 2 (two) times daily as needed. 60 tablet 0  . amphetamine-dextroamphetamine (ADDERALL) 20 MG tablet Take 1/2-1 tablet 1-2 x /day ONLY as needed for ADD  & please try to limit to 5 days /week to avoid addiction 50 tablet 0  . OMEPRAZOLE PO Take by mouth.    . pantoprazole (PROTONIX) 40 MG tablet TAKE ONE TABLET BY MOUTH ONCE DAILY 90 tablet 1   No current facility-administered medications on file prior to visit.    Allergies:  Allergies  Allergen Reactions  . Codeine Nausea And Vomiting  . Penicillins Nausea And Vomiting   Medical History:  She has Depression; GERD (gastroesophageal reflux disease); ADD (attention deficit disorder); Asthma; Tobacco use disorder; and Hyperlipidemia on their problem list. Health Maintenance:   Immunization History  Administered Date(s) Administered  . Tdap 02/07/2017   Tetanus: 2018 Pneumovax: N/A Prevnar 13:  N/A Flu vaccine: N/A Zostavax: N/A  No LMP recorded. Pap: DUE done today MGM: 11/2015 , reminded to schedule CXR 2018 DEXA:  Colonoscopy: EGD:  Vision: wears glasses, sees annually Dental: Dr. Marland Kitchen 2019, goes q3m  Patient Care Team: Lucky Cowboy, MD as PCP - General (Internal Medicine)  Surgical History:  She has a past surgical history that includes External ear surgery; Inner ear surgery; Middle ear surgery; Bladder surgery; Cesarean section; mastoid surgery; and Adenoidectomy (Bilateral). Family History:  Herfamily history includes Brain cancer in her  maternal grandmother; Breast cancer in her maternal grandmother; Cancer in her mother; Diabetes in her father; Heart attack (age of onset: 30) in her paternal grandfather; Hypertension in her father; Lung cancer in her maternal grandfather; Lung cancer (age of onset: 72) in her maternal uncle. Social History:  She reports that she has been smoking e-cigarettes. She has been smoking about 1.00 pack per day. She has never used smokeless tobacco. She reports that she drinks alcohol. She reports that she does not use drugs.  Review of Systems: Review of Systems  Constitutional: Negative.  Negative for malaise/fatigue and weight loss.  HENT: Negative for hearing loss and tinnitus.   Eyes: Negative.  Negative for blurred vision and double vision.  Respiratory: Negative.  Negative for cough, sputum production, shortness of breath and wheezing.   Cardiovascular: Negative.  Negative for chest pain, palpitations, orthopnea, claudication, leg swelling and PND.  Gastrointestinal: Negative for abdominal pain, blood in stool, constipation, diarrhea, heartburn, melena, nausea and vomiting.  Genitourinary: Negative.   Musculoskeletal: Negative.  Negative for falls, joint pain and myalgias.  Skin: Negative.  Negative for rash.  Neurological: Negative.  Negative for dizziness, tingling, sensory change, weakness  and headaches.  Endo/Heme/Allergies: Negative for polydipsia.  Psychiatric/Behavioral: Negative.  Negative for depression, memory loss, substance abuse and suicidal ideas. The patient is not nervous/anxious and does not have insomnia.   All other systems reviewed and are negative.   Physical Exam: Estimated body mass index is 23.83 kg/m as calculated from the following:   Height as of this encounter: 5' 4.5" (1.638 m).   Weight as of this encounter: 141 lb (64 kg). BP 138/88   Pulse 93   Temp 97.7 F (36.5 C)   Ht 5' 4.5" (1.638 m)   Wt 141 lb (64 kg)   SpO2 99%   BMI 23.83 kg/m  General  Appearance: Well nourished, in no apparent distress.  Eyes: PERRLA, EOMs, conjunctiva no swelling or erythema, normal fundi and vessels.  Sinuses: No Frontal/maxillary tenderness  ENT/Mouth: Ext aud canals clear, normal light reflex with TMs without erythema, bulging. Good dentition. No erythema, swelling, or exudate on post pharynx. Tonsils not swollen or erythematous. Hearing normal.  Neck: Supple, thyroid normal. No bruits  Respiratory: Respiratory effort normal, BS equal bilaterally without rales, rhonchi, wheezing or stridor.  Cardio: RRR without murmurs, rubs or gallops. Brisk peripheral pulses without edema.  Chest: symmetric, with normal excursions and percussion.  Breasts: Very dense and irregular throughout bilaterally, without nipple discharge, retractions.  Abdomen: Soft, nontender, no guarding, rebound, hernias, masses, or organomegaly.  Lymphatics: Non tender without lymphadenopathy.  Genitourinary: Female genitalia: not done normal external genitalia, vulva, vagina, cervix, uterus and adnexa Cervix: not indicated, friable scant blood present and lesions absent Musculoskeletal: Full ROM all peripheral extremities,5/5 strength, and normal gait.  Skin: Warm, dry without rashes, lesions, ecchymosis. Neuro: Cranial nerves intact, reflexes equal bilaterally. Normal muscle tone, no cerebellar symptoms. Sensation intact.  Psych: Awake and oriented X 3, normal affect, Insight and Judgment appropriate.   EKG: obtained for baseline, WNL, mild RSR' V1, no ST changes AORTA SCAN: defer  Dan Maker 10:28 AM [facility]

## 2018-03-22 ENCOUNTER — Encounter: Payer: Self-pay | Admitting: Adult Health

## 2018-03-22 ENCOUNTER — Other Ambulatory Visit (HOSPITAL_COMMUNITY)
Admission: RE | Admit: 2018-03-22 | Discharge: 2018-03-22 | Disposition: A | Discharge status: Home / Self Care | Payer: BLUE CROSS/BLUE SHIELD | Source: Ambulatory Visit | Attending: Adult Health | Admitting: Adult Health

## 2018-03-22 ENCOUNTER — Ambulatory Visit (INDEPENDENT_AMBULATORY_CARE_PROVIDER_SITE_OTHER): Payer: BLUE CROSS/BLUE SHIELD | Admitting: Adult Health

## 2018-03-22 VITALS — BP 138/88 | HR 93 | Temp 97.7°F | Ht 64.5 in | Wt 141.0 lb

## 2018-03-22 DIAGNOSIS — Z79899 Other long term (current) drug therapy: Secondary | ICD-10-CM | POA: Insufficient documentation

## 2018-03-22 DIAGNOSIS — R002 Palpitations: Secondary | ICD-10-CM | POA: Diagnosis not present

## 2018-03-22 DIAGNOSIS — Z136 Encounter for screening for cardiovascular disorders: Secondary | ICD-10-CM | POA: Diagnosis not present

## 2018-03-22 DIAGNOSIS — Z131 Encounter for screening for diabetes mellitus: Secondary | ICD-10-CM | POA: Insufficient documentation

## 2018-03-22 DIAGNOSIS — E782 Mixed hyperlipidemia: Secondary | ICD-10-CM

## 2018-03-22 DIAGNOSIS — Z0000 Encounter for general adult medical examination without abnormal findings: Secondary | ICD-10-CM | POA: Diagnosis not present

## 2018-03-22 DIAGNOSIS — Z1329 Encounter for screening for other suspected endocrine disorder: Secondary | ICD-10-CM | POA: Diagnosis not present

## 2018-03-22 DIAGNOSIS — F3341 Major depressive disorder, recurrent, in partial remission: Secondary | ICD-10-CM

## 2018-03-22 DIAGNOSIS — Z13220 Encounter for screening for lipoid disorders: Secondary | ICD-10-CM

## 2018-03-22 DIAGNOSIS — Z1389 Encounter for screening for other disorder: Secondary | ICD-10-CM

## 2018-03-22 DIAGNOSIS — Z124 Encounter for screening for malignant neoplasm of cervix: Secondary | ICD-10-CM | POA: Diagnosis not present

## 2018-03-22 DIAGNOSIS — F988 Other specified behavioral and emotional disorders with onset usually occurring in childhood and adolescence: Secondary | ICD-10-CM

## 2018-03-22 DIAGNOSIS — K219 Gastro-esophageal reflux disease without esophagitis: Secondary | ICD-10-CM

## 2018-03-22 DIAGNOSIS — J45909 Unspecified asthma, uncomplicated: Secondary | ICD-10-CM

## 2018-03-22 DIAGNOSIS — E559 Vitamin D deficiency, unspecified: Secondary | ICD-10-CM | POA: Diagnosis not present

## 2018-03-22 DIAGNOSIS — F17200 Nicotine dependence, unspecified, uncomplicated: Secondary | ICD-10-CM

## 2018-03-22 NOTE — Patient Instructions (Addendum)
HOW TO SCHEDULE A MAMMOGRAM  [facility]  7 a.m.-6:30 p.m., Monday 7 a.m.-5 p.m., Tuesday-Friday Schedule an appointment by calling [phone].  [facility] Schedule an appointment by calling [phone].    Ms. Langlie , Thank you for taking time to come for your Medicare Wellness Visit. I appreciate your ongoing commitment to your health goals. Please review the following plan we discussed and let me know if I can assist you in the future.   These are the goals we discussed: Goals    . Blood Pressure < 130/80    . Quit Smoking       This is a list of the screening recommended for you and due dates:  Health Maintenance  Topic Date Due  . Pap Smear  01/29/1992  . Flu Shot  02/14/2018  . Tetanus Vaccine  02/08/2027  . HIV Screening  Completed     Know what a healthy weight is for you (roughly BMI <25) and aim to maintain this  Aim for 7+ servings of fruits and vegetables daily  65-80+ fluid ounces of water or unsweet tea for healthy kidneys  Limit to max 1 drink of alcohol per day; avoid smoking/tobacco  Limit animal fats in diet for cholesterol and heart health - choose grass fed whenever available  Avoid highly processed foods, and foods high in saturated/trans fats  Aim for low stress - take time to unwind and care for your mental health  Aim for 150 min of moderate intensity exercise weekly for heart health, and weights twice weekly for bone health  Aim for 7-9 hours of sleep daily

## 2018-03-23 LAB — URINALYSIS W MICROSCOPIC + REFLEX CULTURE
BILIRUBIN URINE: NEGATIVE
Bacteria, UA: NONE SEEN /HPF
GLUCOSE, UA: NEGATIVE
HGB URINE DIPSTICK: NEGATIVE
Hyaline Cast: NONE SEEN /LPF
Ketones, ur: NEGATIVE
LEUKOCYTE ESTERASE: NEGATIVE
NITRITES URINE, INITIAL: NEGATIVE
PROTEIN: NEGATIVE
RBC / HPF: NONE SEEN /HPF (ref 0–2)
Specific Gravity, Urine: 1.02 (ref 1.001–1.03)
WBC UA: NONE SEEN /HPF (ref 0–5)
pH: 8 (ref 5.0–8.0)

## 2018-03-23 LAB — CBC WITH DIFFERENTIAL/PLATELET
BASOS PCT: 1.2 %
Basophils Absolute: 72 cells/uL (ref 0–200)
Eosinophils Absolute: 72 cells/uL (ref 15–500)
Eosinophils Relative: 1.2 %
HEMATOCRIT: 41.7 % (ref 35.0–45.0)
Hemoglobin: 13.7 g/dL (ref 11.7–15.5)
LYMPHS ABS: 1164 {cells}/uL (ref 850–3900)
MCH: 28 pg (ref 27.0–33.0)
MCHC: 32.9 g/dL (ref 32.0–36.0)
MCV: 85.1 fL (ref 80.0–100.0)
MPV: 9.3 fL (ref 7.5–12.5)
Monocytes Relative: 11.1 %
NEUTROS ABS: 4026 {cells}/uL (ref 1500–7800)
Neutrophils Relative %: 67.1 %
Platelets: 347 10*3/uL (ref 140–400)
RBC: 4.9 10*6/uL (ref 3.80–5.10)
RDW: 14.5 % (ref 11.0–15.0)
Total Lymphocyte: 19.4 %
WBC: 6 10*3/uL (ref 3.8–10.8)
WBCMIX: 666 {cells}/uL (ref 200–950)

## 2018-03-23 LAB — VITAMIN D 25 HYDROXY (VIT D DEFICIENCY, FRACTURES): Vit D, 25-Hydroxy: 53 ng/mL (ref 30–100)

## 2018-03-23 LAB — LIPID PANEL
CHOL/HDL RATIO: 2.1 (calc) (ref <5.0)
Cholesterol: 247 mg/dL — ABNORMAL HIGH (ref <200)
HDL: 115 mg/dL (ref >50)
LDL CHOLESTEROL (CALC): 118 mg/dL — AB
NON-HDL CHOLESTEROL (CALC): 132 mg/dL — AB (ref <130)
Triglycerides: 50 mg/dL (ref <150)

## 2018-03-23 LAB — TSH: TSH: 1.1 mIU/L

## 2018-03-23 LAB — COMPLETE METABOLIC PANEL WITH GFR
AG RATIO: 1.6 (calc) (ref 1.0–2.5)
ALT: 8 U/L (ref 6–29)
AST: 19 U/L (ref 10–35)
Albumin: 4.5 g/dL (ref 3.6–5.1)
Alkaline phosphatase (APISO): 53 U/L (ref 33–115)
BUN: 8 mg/dL (ref 7–25)
CALCIUM: 9.2 mg/dL (ref 8.6–10.2)
CO2: 28 mmol/L (ref 20–32)
CREATININE: 0.65 mg/dL (ref 0.50–1.10)
Chloride: 102 mmol/L (ref 98–110)
GFR, EST AFRICAN AMERICAN: 123 mL/min/{1.73_m2} (ref >=60)
GFR, EST NON AFRICAN AMERICAN: 106 mL/min/{1.73_m2} (ref >=60)
GLOBULIN: 2.8 g/dL (ref 1.9–3.7)
Glucose, Bld: 76 mg/dL (ref 65–99)
Potassium: 4.3 mmol/L (ref 3.5–5.3)
SODIUM: 138 mmol/L (ref 135–146)
TOTAL PROTEIN: 7.3 g/dL (ref 6.1–8.1)
Total Bilirubin: 0.5 mg/dL (ref 0.2–1.2)

## 2018-03-23 LAB — MICROALBUMIN / CREATININE URINE RATIO
CREATININE, URINE: 122 mg/dL (ref 20–275)
MICROALB UR: 0.3 mg/dL
Microalb Creat Ratio: 2 mcg/mg creat (ref <30)

## 2018-03-23 LAB — MAGNESIUM: Magnesium: 2.1 mg/dL (ref 1.5–2.5)

## 2018-03-23 LAB — NO CULTURE INDICATED

## 2018-03-23 LAB — HEMOGLOBIN A1C
EAG (MMOL/L): 5.5 (calc)
Hgb A1c MFr Bld: 5.1 % of total Hgb (ref <5.7)
Mean Plasma Glucose: 100 (calc)

## 2018-03-27 ENCOUNTER — Other Ambulatory Visit: Payer: Self-pay | Admitting: Adult Health

## 2018-03-27 LAB — CYTOLOGY - PAP
BACTERIAL VAGINITIS: POSITIVE — AB
Candida vaginitis: NEGATIVE
Chlamydia: NEGATIVE
DIAGNOSIS: NEGATIVE
HPV: NOT DETECTED
NEISSERIA GONORRHEA: NEGATIVE

## 2018-03-27 MED ORDER — METRONIDAZOLE 0.75 % VA GEL: 1.0000 | Freq: Every day | VAGINAL | 0 refills | Status: DC

## 2018-04-01 ENCOUNTER — Other Ambulatory Visit: Payer: Self-pay

## 2018-04-01 MED ORDER — AMPHETAMINE-DEXTROAMPHETAMINE 20 MG PO TABS: ORAL_TABLET | ORAL | 0 refills | Status: DC

## 2018-05-03 ENCOUNTER — Other Ambulatory Visit: Payer: Self-pay

## 2018-05-03 DIAGNOSIS — F411 Generalized anxiety disorder: Secondary | ICD-10-CM

## 2018-05-03 MED ORDER — AMPHETAMINE-DEXTROAMPHETAMINE 20 MG PO TABS: ORAL_TABLET | ORAL | 0 refills | Status: DC

## 2018-05-03 MED ORDER — METRONIDAZOLE 0.75 % VA GEL: 1.0000 | Freq: Every day | VAGINAL | 0 refills | Status: DC

## 2018-05-03 MED ORDER — ALPRAZOLAM 0.5 MG PO TABS: 0.5000 mg | ORAL_TABLET | Freq: Two times a day (BID) | ORAL | 0 refills | Status: DC | PRN

## 2018-06-03 ENCOUNTER — Other Ambulatory Visit: Payer: Self-pay

## 2018-06-05 MED ORDER — AMPHETAMINE-DEXTROAMPHETAMINE 20 MG PO TABS: ORAL_TABLET | ORAL | 0 refills | Status: DC

## 2018-07-08 ENCOUNTER — Other Ambulatory Visit: Payer: Self-pay

## 2018-07-08 DIAGNOSIS — F411 Generalized anxiety disorder: Secondary | ICD-10-CM

## 2018-07-08 MED ORDER — ALPRAZOLAM 0.5 MG PO TABS: 0.5000 mg | ORAL_TABLET | Freq: Two times a day (BID) | ORAL | 0 refills | Status: DC | PRN

## 2018-07-08 MED ORDER — AMPHETAMINE-DEXTROAMPHETAMINE 20 MG PO TABS: ORAL_TABLET | ORAL | 0 refills | Status: DC

## 2018-08-21 ENCOUNTER — Other Ambulatory Visit: Payer: Self-pay

## 2018-08-21 MED ORDER — AMPHETAMINE-DEXTROAMPHETAMINE 20 MG PO TABS: ORAL_TABLET | ORAL | 0 refills | Status: DC

## 2018-09-21 ENCOUNTER — Other Ambulatory Visit: Payer: Self-pay

## 2018-09-21 DIAGNOSIS — F411 Generalized anxiety disorder: Secondary | ICD-10-CM

## 2018-09-22 MED ORDER — ALPRAZOLAM 0.5 MG PO TABS: 0.5000 mg | ORAL_TABLET | Freq: Two times a day (BID) | ORAL | 0 refills | Status: DC | PRN

## 2018-09-22 MED ORDER — AMPHETAMINE-DEXTROAMPHETAMINE 20 MG PO TABS: ORAL_TABLET | ORAL | 0 refills | Status: DC

## 2018-09-25 ENCOUNTER — Ambulatory Visit: Payer: Self-pay | Admitting: Physician Assistant

## 2018-10-22 ENCOUNTER — Other Ambulatory Visit: Payer: Self-pay

## 2018-10-22 MED ORDER — AMPHETAMINE-DEXTROAMPHETAMINE 20 MG PO TABS: ORAL_TABLET | ORAL | 0 refills | Status: DC

## 2018-11-25 ENCOUNTER — Other Ambulatory Visit: Payer: Self-pay

## 2018-11-25 MED ORDER — AMPHETAMINE-DEXTROAMPHETAMINE 20 MG PO TABS: ORAL_TABLET | ORAL | 0 refills | Status: DC

## 2018-12-28 ENCOUNTER — Other Ambulatory Visit: Payer: Self-pay

## 2018-12-28 DIAGNOSIS — F411 Generalized anxiety disorder: Secondary | ICD-10-CM

## 2018-12-30 ENCOUNTER — Other Ambulatory Visit: Payer: Self-pay | Admitting: Adult Health

## 2018-12-30 ENCOUNTER — Other Ambulatory Visit: Payer: Self-pay

## 2018-12-30 MED ORDER — AMPHETAMINE-DEXTROAMPHETAMINE 20 MG PO TABS: ORAL_TABLET | ORAL | 0 refills | Status: DC

## 2019-01-08 ENCOUNTER — Other Ambulatory Visit: Payer: Self-pay

## 2019-01-08 ENCOUNTER — Encounter: Payer: Self-pay | Admitting: Adult Health

## 2019-01-08 ENCOUNTER — Ambulatory Visit: Payer: Medicaid Other | Admitting: Adult Health

## 2019-01-08 VITALS — Temp 99.2°F

## 2019-01-08 DIAGNOSIS — Z7189 Other specified counseling: Secondary | ICD-10-CM

## 2019-01-08 DIAGNOSIS — J069 Acute upper respiratory infection, unspecified: Secondary | ICD-10-CM

## 2019-01-08 DIAGNOSIS — J019 Acute sinusitis, unspecified: Secondary | ICD-10-CM

## 2019-01-08 MED ORDER — PREDNISONE 20 MG PO TABS: ORAL_TABLET | ORAL | 0 refills | Status: DC

## 2019-01-08 MED ORDER — AZITHROMYCIN 250 MG PO TABS: ORAL_TABLET | ORAL | 1 refills | Status: DC

## 2019-01-08 MED ORDER — PROMETHAZINE-DM 6.25-15 MG/5ML PO SYRP: 5.0000 mL | ORAL_SOLUTION | Freq: Four times a day (QID) | ORAL | 1 refills | Status: DC | PRN

## 2019-01-08 NOTE — Progress Notes (Signed)
Virtual Visit via Telephone Note  I connected with Vanessa Lee on 01/08/19 at 12:00 PM EDT by telephone and verified that I am speaking with the correct person using two identifiers.  Location: Patient: home Provider: [place] office    I discussed the limitations, risks, security and privacy concerns of performing an evaluation and management service by telephone and the availability of in person appointments. I also discussed with the patient that there may be a patient responsible charge related to this service. The patient expressed understanding and agreed to proceed.  History of Present Illness:  Temp 99.2 F (37.3 C)     48 y.o. female with hx of seasonal allergies and mild intermittent asthma requests evaluation of URI sx; reports began with mild cough on Sunday, developed HA Monday, body aches, sore throat, sinus congestion/pressure; she reports cough is non-productive (dry). She has had fever as high as 101.5 last night, 99.2 this AM.   She denies wheezing, denies dyspnea, chest pains, dizziness. Denies GI sx.   She was recently released from [place] and restarted work on Monday, was wearing mask, no known sick contacts. Has been maintaining social distancing and wearing mask.   Last few weeks she reports she has some flare of allergies over the last few weeks; she takes zyrtec typically but switched to claritin D. She has not taken antipyretics.   No know sick contacts.    Observations/Objective:  General : ill sounding patient in no apparent/aucte distress HEENT: no hoarseness, frequent dry cough during visit Lungs: speaks in complete sentences, no audible wheezing, no apparent distress Neurological: alert, oriented x 3 Psychiatric: pleasant, judgement appropriate  Patient is able to hold breath ~20 sec and reports able to touch chest with chin (no nuchal rigidity)    Assessment and Plan:  Acute non-recurrent sinusitis, unspecified location Vanessa Lee was seen today for  sinus problem, cough, fever, generalized body aches and headache.  Diagnoses and all orders for this visit:  Acute non-recurrent sinusitis, unspecified location Will proceed with abx due to fever and severity; cannot exclude covid, will proceed with zpak Suggested symptomatic OTC remedies. Nasal saline spray for congestion. Nasal steroids, allergy pill, oral steroids offered Follow up as needed. -     azithromycin (ZITHROMAX) 250 MG tablet; Take 2 tablets (500 mg) on  Day 1,  followed by 1 tablet (250 mg) once daily on Days 2 through 5. -     predniSONE (DELTASONE) 20 MG tablet; 2 tablets daily for 3 days, 1 tablet daily for 4 days.  URI with cough and congestion/Advice Given About Covid-19 Virus by Telephone Cannot r/o covid 19 due to symptoms and local infection rates; recommended she be tested via [facility] drive through and remain in quarantine until negative test or she has met CDC criteria (10+ days since onset of symptoms, fever controlled off of antipyretics for 3 days, and improvement in respiratory symptoms).  Call office if developing any dyspnea, chest pains, worsening of symptoms. Advised tylenol for fever.   Symptomatic support reviewed; emphasized hydration; she is on vitamin D;  -     promethazine-dextromethorphan (PROMETHAZINE-DM) 6.25-15 MG/5ML syrup; Take 5 mLs by mouth 4 (four) times daily as needed for cough.  Follow Up Instructions:    I discussed the assessment and treatment plan with the patient. The patient was provided an opportunity to ask questions and all were answered. The patient agreed with the plan and demonstrated an understanding of the instructions.   The patient was advised to call back  or seek an in-person evaluation if the symptoms worsen or if the condition fails to improve as anticipated.  I provided 20 minutes of non-face-to-face time during this encounter.   Izora Ribas, NP

## 2019-01-14 ENCOUNTER — Other Ambulatory Visit: Payer: Self-pay | Admitting: Adult Health

## 2019-01-14 DIAGNOSIS — J019 Acute sinusitis, unspecified: Secondary | ICD-10-CM

## 2019-01-14 MED ORDER — AZITHROMYCIN 250 MG PO TABS: ORAL_TABLET | ORAL | 1 refills | Status: AC

## 2019-01-14 MED ORDER — ACYCLOVIR 400 MG PO TABS: ORAL_TABLET | ORAL | 1 refills | Status: DC

## 2019-02-04 NOTE — Progress Notes (Signed)
FOLLOW UP  Assessment and Plan:   Recurrent major depressive disorder, in partial remission (HCC) -  stress management techniques discussed, increase water, good sleep hygiene discussed, increase exercise, and increase veggies.  - rare use of xanax  Tobacco use disorder -  instruction/counseling given, counseled patient on the dangers of tobacco use, advised patient to stop smoking, and reviewed strategies to maximize success, patient not ready to quit at this time.   Gastroesophageal reflux disease without esophagitis Continue PPI/H2 blocker, diet discussed  Uncomplicated asthma, unspecified asthma severity, unspecified whether persistent Avoid triggers, controlled at this time  Attention deficit disorder, unspecified hyperactivity presence -  Continue ADD medication, helps with focus, no AE's. The patient was counseled on the addictive nature of the medication and was encouraged to take drug holidays when not needed.   Hyperlipidemia, unspecified hyperlipidemia type -continue medications, check lipids, decrease fatty foods, increase activity.  Defer lipid panel to CPE due to no insurance   Vitamin D deficiency Near goal at recent check; continue to recommend supplementation for goal of 60-100 Defer vitamin D level to CPE   Continue diet and meds as discussed. Further disposition pending results of labs. Discussed med's effects and SE's.   Over 30 minutes of exam, counseling, chart review, and critical decision making was performed.   No future appointments.  ----------------------------------------------------------------------------------------------------------------------  HPI 48 y.o. female  presents for 6 month follow up on cholesterol, anxiety, ADD, smoking and vitamin D deficiency.   She has had a stressful year in court fighting with her ex.  she has a diagnosis of anxiety and is currently on PRN xanax, reports symptoms are well controlled on current regimen. she  currently uses rarely, typically when in court, etc. Averages 0-2 tabs.   Patient is on an ADD medication, she states that the medication is helping and she denies any adverse reactions. She currently takes 1-2 tabs on days that she works, Mon-Fri or on sick days.   she currently vapes daily; discussed risks associated with smoking, patient is not ready to quit. She has cut down on mg of nicotine but doesn't want to quit.   BMI is Body mass index is 23.66 kg/m., she has been working on diet, walks ~17000 steps at work.  Wt Readings from Last 3 Encounters:  02/05/19 140 lb (63.5 kg)  03/22/18 141 lb (64 kg)  08/10/17 146 lb 6.4 oz (66.4 kg)   Today their BP is BP: 132/86  She does workout. She denies chest pain, shortness of breath, dizziness.   She is not on cholesterol medication. Her cholesterol is not at goal. The cholesterol last visit was:   Lab Results  Component Value Date   CHOL 247 (H) 03/22/2018   HDL 115 03/22/2018   LDLCALC 118 (H) 03/22/2018   TRIG 50 03/22/2018   CHOLHDL 2.1 03/22/2018   Patient is on Vitamin D supplement, 10000 IU daily   Lab Results  Component Value Date   VD25OH 53 03/22/2018        Current Medications:  Current Outpatient Medications on File Prior to Visit  Medication Sig  . acyclovir (ZOVIRAX) 400 MG tablet Take 1 pill 3 times daily at start on cold sore (Patient taking differently: as needed. Take 1 pill 3 times daily at start on cold sore)  . ALPRAZolam (XANAX) 0.5 MG tablet Take 1 tablet (0.5 mg total) by mouth 2 (two) times daily as needed.  Marland Kitchen. amphetamine-dextroamphetamine (ADDERALL) 20 MG tablet Take 1/2-1 tablet 1-2 x /day  ONLY as needed for ADD  & please try to limit to 5 days /week to avoid addiction  . CHOLECALCIFEROL PO Take 10,000 Units by mouth daily.  . Loratadine-Pseudoephedrine (CLARITIN-D 24 HOUR PO) Take by mouth daily.  Marland Kitchen OMEPRAZOLE PO Take by mouth as needed.   . pantoprazole (PROTONIX) 40 MG tablet TAKE ONE TABLET BY  MOUTH ONCE DAILY  . predniSONE (DELTASONE) 20 MG tablet 2 tablets daily for 3 days, 1 tablet daily for 4 days.  . promethazine-dextromethorphan (PROMETHAZINE-DM) 6.25-15 MG/5ML syrup Take 5 mLs by mouth 4 (four) times daily as needed for cough.   No current facility-administered medications on file prior to visit.      Allergies:  Allergies  Allergen Reactions  . Codeine Nausea And Vomiting  . Penicillins Nausea And Vomiting     Medical History:  Past Medical History:  Diagnosis Date  . ADD (attention deficit disorder)   . Anemia   . Asthma   . Depression   . GERD (gastroesophageal reflux disease)    Family history- Reviewed and unchanged Social history- Reviewed and unchanged   Review of Systems:  Review of Systems  Constitutional: Negative for malaise/fatigue and weight loss.  HENT: Negative for hearing loss and tinnitus.   Eyes: Negative for blurred vision and double vision.  Respiratory: Negative for cough, shortness of breath and wheezing.   Cardiovascular: Negative for chest pain, palpitations, orthopnea, claudication and leg swelling.  Gastrointestinal: Negative for abdominal pain, blood in stool, constipation, diarrhea, heartburn, melena, nausea and vomiting.  Genitourinary: Negative.   Musculoskeletal: Negative for joint pain and myalgias.  Skin: Negative for rash.  Neurological: Negative for dizziness, tingling, sensory change, weakness and headaches.  Endo/Heme/Allergies: Negative for polydipsia.  Psychiatric/Behavioral: Negative.   All other systems reviewed and are negative.     Physical Exam: BP 132/86   Pulse 90   Temp (!) 97.5 F (36.4 C)   Wt 140 lb (63.5 kg)   LMP 02/03/2019 (Exact Date)   SpO2 99%   BMI 23.66 kg/m  Wt Readings from Last 3 Encounters:  02/05/19 140 lb (63.5 kg)  03/22/18 141 lb (64 kg)  08/10/17 146 lb 6.4 oz (66.4 kg)   General Appearance: Well nourished, in no apparent distress. Eyes: PERRLA, EOMs, conjunctiva no  swelling or erythema Sinuses: No Frontal/maxillary tenderness ENT/Mouth: Ext aud canals clear, TMs without erythema, bulging. No erythema, swelling, or exudate on post pharynx.  Tonsils not swollen or erythematous. Hearing normal.  Neck: Supple, thyroid normal.  Respiratory: Respiratory effort normal, BS equal bilaterally without rales, rhonchi, wheezing or stridor.  Cardio: RRR with no MRGs. Brisk peripheral pulses without edema.  Abdomen: Soft, + BS.  Non tender, no guarding, rebound, hernias, masses. Lymphatics: Non tender without lymphadenopathy.  Musculoskeletal: Full ROM, 5/5 strength, Normal gait Skin: Warm, dry without rashes, lesions, ecchymosis.  Neuro: Cranial nerves intact. No cerebellar symptoms.  Psych: Awake and oriented X 3, normal affect, Insight and Judgment appropriate.    Izora Ribas, NP 5:14 PM [facility]

## 2019-02-05 ENCOUNTER — Ambulatory Visit (INDEPENDENT_AMBULATORY_CARE_PROVIDER_SITE_OTHER): Payer: Self-pay | Admitting: Adult Health

## 2019-02-05 ENCOUNTER — Encounter: Payer: Self-pay | Admitting: Adult Health

## 2019-02-05 ENCOUNTER — Other Ambulatory Visit: Payer: Self-pay

## 2019-02-05 VITALS — BP 132/86 | HR 90 | Temp 97.5°F | Wt 140.0 lb

## 2019-02-05 DIAGNOSIS — E782 Mixed hyperlipidemia: Secondary | ICD-10-CM

## 2019-02-05 DIAGNOSIS — F988 Other specified behavioral and emotional disorders with onset usually occurring in childhood and adolescence: Secondary | ICD-10-CM

## 2019-02-05 DIAGNOSIS — F17200 Nicotine dependence, unspecified, uncomplicated: Secondary | ICD-10-CM

## 2019-02-05 DIAGNOSIS — Z6823 Body mass index (BMI) 23.0-23.9, adult: Secondary | ICD-10-CM

## 2019-02-05 DIAGNOSIS — K219 Gastro-esophageal reflux disease without esophagitis: Secondary | ICD-10-CM

## 2019-02-05 DIAGNOSIS — F3341 Major depressive disorder, recurrent, in partial remission: Secondary | ICD-10-CM

## 2019-02-05 DIAGNOSIS — J45909 Unspecified asthma, uncomplicated: Secondary | ICD-10-CM

## 2019-02-05 MED ORDER — AMPHETAMINE-DEXTROAMPHETAMINE 20 MG PO TABS: ORAL_TABLET | ORAL | 0 refills | Status: DC

## 2019-02-05 NOTE — Patient Instructions (Addendum)
Goals    . Blood Pressure < 130/80    . Quit Smoking      Zinc 30-40 mg daily     Preventing High Cholesterol Cholesterol is a white, waxy substance similar to fat that the human body needs to help build cells. The liver makes all the cholesterol that a person's body needs. Having high cholesterol (hypercholesterolemia) increases a person's risk for heart disease and stroke. Extra (excess) cholesterol comes from the food the person eats. High cholesterol can often be prevented with diet and lifestyle changes. If you already have high cholesterol, you can control it with diet and lifestyle changes and with medicine. How can high cholesterol affect me? If you have high cholesterol, deposits (plaques) may build up on the walls of your arteries. The arteries are the blood vessels that carry blood away from your heart. Plaques make the arteries narrower and stiffer. This can limit or block blood flow and cause blood clots to form. Blood clots:  Are tiny balls of cells that form in your blood.  Can move to the heart or brain, causing a heart attack or stroke. Plaques in arteries greatly increase your risk for heart attack and stroke.Making diet and lifestyle changes can reduce your risk for these conditions that may threaten your life. What can increase my risk? This condition is more likely to develop in people who:  Eat foods that are high in saturated fat or cholesterol. Saturated fat is mostly found in: ? Foods that contain animal fat, such as red meat and some dairy products. ? Certain fatty foods made from plants, such as tropical oils.  Are overweight.  Are not getting enough exercise.  Have a family history of high cholesterol. What actions can I take to prevent this? Nutrition   Eat less saturated fat.  Avoid trans fats (partially hydrogenated oils). These are often found in margarine and in some baked goods, fried foods, and snacks bought in packages.  Avoid precooked or  cured meat, such as sausages or meat loaves.  Avoid foods and drinks that have added sugars.  Eat more fruits, vegetables, and whole grains.  Choose healthy sources of protein, such as fish, poultry, lean cuts of red meat, beans, peas, lentils, and nuts.  Choose healthy sources of fat, such as: ? Nuts. ? Vegetable oils, especially olive oil. ? Fish that have healthy fats (omega-3 fatty acids), such as mackerel or salmon. The items listed above may not be a complete list of recommended foods and beverages. Contact a dietitian for more information. Lifestyle  Lose weight if you are overweight. Losing 5-10 lb (2.3-4.5 kg) can help prevent or control high cholesterol. It can also lower your risk for diabetes and high blood pressure. Ask your health care provider to help you with a diet and exercise plan to lose weight safely.  Do not use any products that contain nicotine or tobacco, such as cigarettes, e-cigarettes, and chewing tobacco. If you need help quitting, ask your health care provider.  Limit your alcohol intake. ? Do not drink alcohol if:  Your health care provider tells you not to drink.  You are pregnant, may be pregnant, or are planning to become pregnant. ? If you drink alcohol:  Limit how much you use to:  0-1 drink a day for women.  0-2 drinks a day for men.  Be aware of how much alcohol is in your drink. In the [place] one drink equals one 12 oz bottle of beer (355 mL),  one 5 oz glass of wine (148 mL), or one 1 oz glass of hard liquor (44 mL). Activity   Get enough exercise. Each week, do at least 150 minutes of exercise that takes a medium level of effort (moderate-intensity exercise). ? This is exercise that:  Makes your heart beat faster and makes you breathe harder than usual.  Allows you to still be able to talk. ? You could exercise in short sessions several times a day or longer sessions a few times a week. For example, on 5 days each week, you could walk  fast or ride your bike 3 times a day for 10 minutes each time.  Do exercises as told by your health care provider. Medicines  In addition to diet and lifestyle changes, your health care provider may recommend medicines to help lower cholesterol. This may be a medicine to lower the amount of cholesterol your liver makes. You may need medicine if: ? Diet and lifestyle changes do not lower your cholesterol enough. ? You have high cholesterol and other risk factors for heart disease or stroke.  Take over-the-counter and prescription medicines only as told by your health care provider. General information  Manage your risk factors for high cholesterol. Talk with your health care provider about all your risk factors and how to lower your risk.  Manage other conditions that you have, such as diabetes or high blood pressure (hypertension).  Have blood tests to check your cholesterol levels at regular points in time as told by your health care provider.  Keep all follow-up visits as told by your health care provider. This is important. Where to find more information  American Heart Association: www.heart.org  National Heart, Lung, and Blood Institute: PopSteam.iswww.nhlbi.nih.gov Summary  High cholesterol increases your risk for heart disease and stroke. By keeping your cholesterol level low, you can reduce your risk for these conditions.  High cholesterol can often be prevented with diet and lifestyle changes.  Work with your health care provider to manage your risk factors, and have your blood tested regularly. This information is not intended to replace advice given to you by your health care provider. Make sure you discuss any questions you have with your health care provider. Document Released: 07/18/2015 Document Revised: 10/25/2018 Document Reviewed: 03/11/2016 Elsevier Patient Education  2020 [employer]

## 2019-02-14 ENCOUNTER — Encounter: Payer: Self-pay | Admitting: Adult Health

## 2019-03-03 ENCOUNTER — Other Ambulatory Visit: Payer: Self-pay | Admitting: Adult Health

## 2019-03-03 ENCOUNTER — Other Ambulatory Visit: Payer: Self-pay

## 2019-03-03 DIAGNOSIS — F411 Generalized anxiety disorder: Secondary | ICD-10-CM

## 2019-03-03 MED ORDER — ALPRAZOLAM 0.5 MG PO TABS: 0.5000 mg | ORAL_TABLET | Freq: Two times a day (BID) | ORAL | 0 refills | Status: DC | PRN

## 2019-03-12 ENCOUNTER — Other Ambulatory Visit: Payer: Self-pay

## 2019-03-13 ENCOUNTER — Other Ambulatory Visit: Payer: Self-pay

## 2019-03-17 ENCOUNTER — Other Ambulatory Visit: Payer: Self-pay | Admitting: Adult Health

## 2019-03-20 ENCOUNTER — Other Ambulatory Visit: Payer: Self-pay | Admitting: Adult Health

## 2019-03-20 MED ORDER — AMPHETAMINE-DEXTROAMPHETAMINE 20 MG PO TABS: ORAL_TABLET | ORAL | 0 refills | Status: DC

## 2019-03-28 ENCOUNTER — Encounter: Payer: Self-pay | Admitting: Adult Health

## 2019-04-22 ENCOUNTER — Other Ambulatory Visit: Payer: Self-pay

## 2019-04-22 MED ORDER — AMPHETAMINE-DEXTROAMPHETAMINE 20 MG PO TABS: ORAL_TABLET | ORAL | 0 refills | Status: DC

## 2019-05-26 ENCOUNTER — Encounter (INDEPENDENT_AMBULATORY_CARE_PROVIDER_SITE_OTHER): Payer: Self-pay

## 2019-05-26 ENCOUNTER — Other Ambulatory Visit: Payer: Self-pay

## 2019-05-26 DIAGNOSIS — J019 Acute sinusitis, unspecified: Secondary | ICD-10-CM

## 2019-05-26 DIAGNOSIS — F411 Generalized anxiety disorder: Secondary | ICD-10-CM

## 2019-05-26 MED ORDER — PREDNISONE 20 MG PO TABS: ORAL_TABLET | ORAL | 0 refills | Status: DC

## 2019-05-26 MED ORDER — ALPRAZOLAM 0.5 MG PO TABS: 0.5000 mg | ORAL_TABLET | Freq: Two times a day (BID) | ORAL | 0 refills | Status: DC | PRN

## 2019-05-26 MED ORDER — CEFIXIME 400 MG PO CAPS: 400.0000 mg | ORAL_CAPSULE | Freq: Every day | ORAL | 0 refills | Status: DC

## 2019-05-26 MED ORDER — AMPHETAMINE-DEXTROAMPHETAMINE 20 MG PO TABS: ORAL_TABLET | ORAL | 0 refills | Status: DC

## 2019-05-26 NOTE — Telephone Encounter (Signed)
Virtual Visit via Telephone Note  I connected with Vanessa Lee on 05/26/2019 at 1300 by telephone and verified that I am speaking with the correct person using two identifiers.  Location: Patient: Home Provider: office    I discussed the limitations, risks, security and privacy concerns of performing an evaluation and management service by telephone and the availability of in person appointments. I also discussed with the patient that there may be a patient responsible charge related to this service. The patient expressed understanding and agreed to proceed.   History of Present Illness:  48 y.o. with hx of allergies, childhood asthmatic, currently a daily e cig user requests telehealth evaluation due to persistent "sinus" symptoms.   She reports this began over 1 week ago, was feeling fatigued, sneezing, tingling sensation in nose, nasal congestion, with mild headache which began 3 days later. She presented for covid 19 testing on 05/21/2019 which returned. She reports symptoms have progressed since that time, has been getting sores in nose and nose bleeds, generalized sinus pressure/pain. She denies fever/chills at any point. She endorses mild cough in AM which she assocaites with nasal drainage.   She has been taking daily allergy medication (zyrtec) which hasn't improved sx, typically works. She has taken tylenol cold medication for headache which has helped. She has not tried nasal sprays. Hasn't been doing nasal sprays due to irritation.   She reports penicillin allergy, "felt bad" with doxy in the past.    Current Outpatient Medications on File Prior to Visit  Medication Sig Dispense Refill  . acyclovir (ZOVIRAX) 400 MG tablet Take 1 pill 3 times daily at start on cold sore (Patient taking differently: as needed. Take 1 pill 3 times daily at start on cold sore) 50 tablet 1  . CHOLECALCIFEROL PO Take 10,000 Units by mouth daily.    . Loratadine-Pseudoephedrine (CLARITIN-D 24 HOUR PO)  Take by mouth daily.    Marland Kitchen OMEPRAZOLE PO Take by mouth as needed.      No current facility-administered medications on file prior to visit.     Allergies:  Allergies  Allergen Reactions  . Codeine Nausea And Vomiting  . Penicillins Nausea And Vomiting   Medical History:  has Depression; GERD (gastroesophageal reflux disease); ADD (attention deficit disorder); Asthma; Tobacco use disorder; and Hyperlipidemia on their problem list. Surgical History:  She  has a past surgical history that includes External ear surgery; Inner ear surgery; Middle ear surgery; Bladder surgery; Cesarean section; mastoid surgery; and Adenoidectomy (Bilateral). Social History:   reports that she has been smoking e-cigarettes. She has been smoking about 1.00 pack per day. She has never used smokeless tobacco. She reports current alcohol use. She reports that she does not use drugs.    Observations/Objective:  General : Well sounding patient in no apparent distress HEENT: no hoarseness, no cough for duration of visit, mildly nasal vocal quality Lungs: speaks in complete sentences, no audible wheezing, no apparent distress Neurological: alert, oriented x 3 Psychiatric: pleasant, judgement appropriate    Assessment and Plan:  Diagnoses and all orders for this visit:  Acute non-recurrent sinusitis, unspecified location Will proceed with treatment due to progressive symptoms lasting 10+ days Suggested symptomatic OTC remedies. STOP smoking Nasal saline spray for congestion. Rotate allergy pill  oral steroids offered Follow up as needed. The patient was advised to call immediately if she has any concerning symptoms in the interval. The patient voices understanding of current treatment options and is in agreement with the current care plan.The  patient knows to call the clinic with any problems, questions or concerns or go to the ER if any further progression of symptoms.  -     cefixime (SUPRAX) 400 MG CAPS  capsule; Take 1 capsule (400 mg total) by mouth daily. -     predniSONE (DELTASONE) 20 MG tablet; 2 tablets daily for 3 days, 1 tablet daily for 4 days.   Follow Up Instructions:    I discussed the assessment and treatment plan with the patient. The patient was provided an opportunity to ask questions and all were answered. The patient agreed with the plan and demonstrated an understanding of the instructions.   The patient was advised to call back or seek an in-person evaluation if the symptoms worsen or if the condition fails to improve as anticipated.  I provided 15 minutes of non-face-to-face time during this encounter.   Dan Maker, NP

## 2019-05-27 MED ORDER — AMPHETAMINE-DEXTROAMPHETAMINE 20 MG PO TABS: ORAL_TABLET | ORAL | 0 refills | Status: DC

## 2019-05-27 MED ORDER — SULFAMETHOXAZOLE-TRIMETHOPRIM 800-160 MG PO TABS: 1.0000 | ORAL_TABLET | Freq: Two times a day (BID) | ORAL | 0 refills | Status: DC

## 2019-07-02 ENCOUNTER — Other Ambulatory Visit: Payer: Self-pay

## 2019-07-03 MED ORDER — AMPHETAMINE-DEXTROAMPHETAMINE 20 MG PO TABS: ORAL_TABLET | ORAL | 0 refills | Status: DC

## 2019-08-05 ENCOUNTER — Other Ambulatory Visit: Payer: Self-pay

## 2019-08-05 DIAGNOSIS — F411 Generalized anxiety disorder: Secondary | ICD-10-CM

## 2019-08-06 MED ORDER — ALPRAZOLAM 0.5 MG PO TABS: 0.5000 mg | ORAL_TABLET | Freq: Two times a day (BID) | ORAL | 0 refills | Status: DC | PRN

## 2019-08-06 MED ORDER — AMPHETAMINE-DEXTROAMPHETAMINE 20 MG PO TABS: ORAL_TABLET | ORAL | 0 refills | Status: DC

## 2019-08-06 NOTE — Progress Notes (Signed)
FOLLOW UP  Assessment and Plan:   Recurrent major depressive disorder, in partial remission ([place]) -  stress management techniques discussed, increase water, good sleep hygiene discussed, increase exercise, and increase veggies.  - rare use of xanax  Tobacco use disorder -  instruction/counseling given, counseled patient on the dangers of tobacco use, advised patient to stop smoking, and reviewed strategies to maximize success, patient not ready to quit at this time.   Gastroesophageal reflux disease without esophagitis Continue PPI/H2 blocker, diet discussed  Uncomplicated asthma, unspecified asthma severity, unspecified whether persistent Avoid triggers, controlled at this time  Attention deficit disorder, unspecified hyperactivity presence -  Continue ADD medication, helps with focus, no AE's. The patient was counseled on the addictive nature of the medication and was encouraged to take drug holidays when not needed.   Hyperlipidemia, unspecified hyperlipidemia type - Mild elevations managed by lifestyle  -check lipids, decrease fatty foods, increase activity.   Vitamin D deficiency Near goal at recent check; continue to recommend supplementation for goal of 60-100 Defer vitamin D level due to no insurance  Declines all labs today; no insurance currently; check labs next OV  Continue diet and meds as discussed. Further disposition pending results of labs. Discussed med's effects and SE's.   Over 30 minutes of exam, counseling, chart review, and critical decision making was performed.   No future appointments.  ----------------------------------------------------------------------------------------------------------------------  HPI 49 y.o. female  presents for 6 month follow up on cholesterol, anxiety, ADD, smoking and vitamin D deficiency.   No insurance currently; hoping to have insurance by next visit in 6 months.   She has had stress fighting with her ex.  she has a  diagnosis of anxiety and is currently on PRN xanax, reports symptoms are well controlled on current regimen. she currently uses rarely, typically when in court, etc. Averages 0-2 tabs. Last filled 05/26/2019 per PDMP review  Patient is on an ADD medication, she states that the medication is helping and she denies any adverse reactions. She currently takes 1-2 tabs on days that she works, Mon-Fri or on sick days.   she currently vapes daily; discussed risks associated with smoking, patient is not ready to quit. She has cut down on mg of nicotine but doesn't want to quit.   She has GERD well controlled recently, takes omperazole PRN, only needing once a week or so.   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   Wt Readings from Last 3 Encounters:  08/11/19 138 lb 9.6 oz (62.9 kg)  02/05/19 140 lb (63.5 kg)  03/22/18 141 lb (64 kg)   Today their BP is BP: 122/80  She does workout. She denies chest pain, shortness of breath, dizziness.   She is not on cholesterol medication. Her cholesterol is not at goal. The cholesterol last visit was:   Lab Results  Component Value Date   CHOL 247 (H) 03/22/2018   HDL 115 03/22/2018   LDLCALC 118 (H) 03/22/2018   TRIG 50 03/22/2018   CHOLHDL 2.1 03/22/2018   Patient is on Vitamin D supplement, 10000 IU daily   Lab Results  Component Value Date   VD25OH 53 03/22/2018        Current Medications:  Current Outpatient Medications on File Prior to Visit  Medication Sig  . acyclovir (ZOVIRAX) 400 MG tablet Take 1 pill 3 times daily at start on cold sore (  Patient taking differently: as needed. Take 1 pill 3 times daily at start on cold sore)  . ALPRAZolam (XANAX) 0.5 MG tablet Take 1 tablet (0.5 mg total) by mouth 2 (two) times daily as needed. Limit to <5 days per week to avoid addiction/tolerance.  Marland Kitchen amphetamine-dextroamphetamine (ADDERALL) 20 MG tablet  Take 1/2-1 tablet 1-2 x /day ONLY as needed for ADD  & please try to limit to 5 days /week to avoid addiction.  . CHOLECALCIFEROL PO Take 10,000 Units by mouth daily.  . Loratadine-Pseudoephedrine (CLARITIN-D 24 HOUR PO) Take by mouth daily.  Marland Kitchen OMEPRAZOLE PO Take by mouth as needed.    No current facility-administered medications on file prior to visit.     Allergies:  Allergies  Allergen Reactions  . Codeine Nausea And Vomiting  . Penicillins Nausea And Vomiting     Medical History:  Past Medical History:  Diagnosis Date  . ADD (attention deficit disorder)   . Anemia   . Asthma   . Depression   . GERD (gastroesophageal reflux disease)    Family history- Reviewed and unchanged Social history- Reviewed and unchanged   Review of Systems:  Review of Systems  Constitutional: Negative for malaise/fatigue and weight loss.  HENT: Negative for hearing loss and tinnitus.   Eyes: Negative for blurred vision and double vision.  Respiratory: Negative for cough, shortness of breath and wheezing.   Cardiovascular: Negative for chest pain, palpitations, orthopnea, claudication and leg swelling.  Gastrointestinal: Negative for abdominal pain, blood in stool, constipation, diarrhea, heartburn, melena, nausea and vomiting.  Genitourinary: Negative.   Musculoskeletal: Negative for joint pain and myalgias.  Skin: Negative for rash.  Neurological: Negative for dizziness, tingling, sensory change, weakness and headaches.  Endo/Heme/Allergies: Negative for polydipsia.  Psychiatric/Behavioral: Negative.   All other systems reviewed and are negative.     Physical Exam: BP 122/80   Pulse 82   Temp 97.6 F (36.4 C)   Wt 138 lb 9.6 oz (62.9 kg)   SpO2 98%   BMI 23.42 kg/m  Wt Readings from Last 3 Encounters:  08/11/19 138 lb 9.6 oz (62.9 kg)  02/05/19 140 lb (63.5 kg)  03/22/18 141 lb (64 kg)   General Appearance: Well nourished, in no apparent distress. Eyes: PERRLA, EOMs,  conjunctiva no swelling or erythema Sinuses: No Frontal/maxillary tenderness ENT/Mouth: Ext aud canals clear, TMs without erythema, bulging. No erythema, swelling, or exudate on post pharynx.  Tonsils not swollen or erythematous. Hearing normal.  Neck: Supple, thyroid normal.  Respiratory: Respiratory effort normal, BS equal bilaterally without rales, rhonchi, wheezing or stridor.  Cardio: RRR with no MRGs. Brisk peripheral pulses without edema.  Abdomen: Soft, + BS.  Non tender, no guarding, rebound, hernias, masses. Lymphatics: Non tender without lymphadenopathy.  Musculoskeletal: Full ROM, 5/5 strength, Normal gait Skin: Warm, dry without rashes, lesions, ecchymosis.  Neuro: Cranial nerves intact. No cerebellar symptoms.  Psych: Awake and oriented X 3, normal affect, Insight and Judgment appropriate.    Dan Maker, NP 8:54 AM [facility]

## 2019-08-11 ENCOUNTER — Ambulatory Visit (INDEPENDENT_AMBULATORY_CARE_PROVIDER_SITE_OTHER): Payer: Medicaid Other | Admitting: Adult Health

## 2019-08-11 ENCOUNTER — Encounter: Payer: Self-pay | Admitting: Adult Health

## 2019-08-11 ENCOUNTER — Other Ambulatory Visit: Payer: Self-pay

## 2019-08-11 VITALS — BP 122/80 | HR 82 | Temp 97.6°F | Wt 138.6 lb

## 2019-08-11 DIAGNOSIS — F3341 Major depressive disorder, recurrent, in partial remission: Secondary | ICD-10-CM

## 2019-08-11 DIAGNOSIS — F988 Other specified behavioral and emotional disorders with onset usually occurring in childhood and adolescence: Secondary | ICD-10-CM

## 2019-08-11 DIAGNOSIS — K219 Gastro-esophageal reflux disease without esophagitis: Secondary | ICD-10-CM

## 2019-08-11 DIAGNOSIS — J45909 Unspecified asthma, uncomplicated: Secondary | ICD-10-CM

## 2019-08-11 DIAGNOSIS — F17200 Nicotine dependence, unspecified, uncomplicated: Secondary | ICD-10-CM

## 2019-08-11 DIAGNOSIS — E782 Mixed hyperlipidemia: Secondary | ICD-10-CM

## 2019-08-11 NOTE — Patient Instructions (Addendum)
Goals    . Blood Pressure < 130/80    . LDL CALC < 100    . Quit Smoking       Electronic Cigarette Information  Electronic cigarettes, or e-cigarettes, are battery-operated devices that deliver nicotine--a very addictive drug--to the body. They come in many shapes, including in the shape of a cigarette, pipe, pen, and even a USB memory stick. E-cigarettes have a cartridge that contains a liquid form of nicotine. When a person uses the device, the liquid heats up. It then becomes a vapor. Inhaling this vapor is called vaping. Nicotine is thought to increase your risk for certain types of cancer. In addition to nicotine, e-cigarettes may contain other harmful and cancer-causing chemicals, including:  Formaldehyde.  Acetaldehyde.  Heavy metals.  Ultra-fine particles that can get inhaled deep into the lungs.  Chemical colorings and flavorings. It is not clear how much nicotine you get when vaping, and it is hard to know what chemicals are in the vaping liquids. The health effects of vaping are not completely known, but you should be aware of the possible dangers of using these products. Some people may use e-cigarettes in order to quit smoking tobacco. However, this has not been proven to work, and the Education officer, environmental (FDA) has not approved e-cigarettes for this purpose. How can using electronic cigarettes affect me?  You may be at risk for developing a dangerous lung disease. There are reports of an increasing number of cases involving serious lung problems, and even death, associated with e-cigarette use. Your risk may be even higher if you: ? Buy e-cigarettes or vaping oils off the street. ? Add any substances to the e-cigarettes that are not intended by the manufacturer.  Vaping may make you crave nicotine. Nicotine does the following: ? Changes your blood sugar levels. ? Increases your heart rate, blood pressure, and breathing rate. ? Increases your risk of developing  blood clots (hypercoagulable state) and diabetes. ? Increases your risk of gum disease that may lead to losing teeth.  If you smoke e-cigarettes, you may be more likely to start smoking or to smoke more tobacco cigarettes.  Becoming addicted to nicotine may make your brain more sensitive to other addictive drugs. You may move to other addictive substances.  You may be in danger of overdosing on nicotine. Nicotine poisoning can cause nausea, vomiting, seizures, and trouble breathing.  An e-cigarette may explode and cause fires and burn injuries.  If you are pregnant, the nicotine in e-cigarettes may be harmful to your baby. Nicotine can cause: ? Brain or lung problems for your baby. ? Your baby to be born too early. ? Your baby to be born with a low birth weight.  Vaping has also been linked to decreases in memory and attention span in children and teens. What actions can I take to stop vaping? If you can, stop vaping on your own before you become addicted to nicotine. If you need help quitting, ask your health care provider. There are three effective ways to fight nicotine addiction:  Nicotine replacement therapy. Using nicotine gum or a nicotine patch blocks your craving for nicotine. Over time, you can reduce the amount of nicotine you use until you can stop using nicotine completely without having cravings.  Prescription medicines approved to fight nicotine addiction. These stop nicotine cravings or block the effects of nicotine.  Behavioral therapy. This may include: ? A self-help smoking cessation program. ? Individual or group therapy. ? A smoking  cessation support group. There are several national programs to help you quit smoking or vaping. These include:  Text message programs, such as SmokefreeTXT.  Apps for mobile phones, including the free quitSTART app.  Hotlines, such as 1-800-QUIT-NOW 765-283-6368). Where to find support You can get support at these  sites:  U.S. Department of Health and Human Services: https://smokefree.gov  American Lung Association: www.lung.org Where to find more information Learn more about e-cigarettes from:  [employer] on Drug Abuse: motorcyclefax.com  Centers for Disease Control and Prevention: http://www.wolf.info/ Summary  E-cigarettes can cause nicotine addiction.  E-cigarettes are not approved as a way to stop smoking. They are not a risk-free alternative to smoking tobacco.  There are reports of an increasing number of cases involving serious lung problems, and even death, associated with e-cigarette use.  If you can stop vaping on your own, do it before you become addicted to nicotine. If you need help quitting, ask your health care provider.  There are various methods and programs that can help you stop smoking or vaping. This information is not intended to replace advice given to you by your health care provider. Make sure you discuss any questions you have with your health care provider. Document Revised: 10/29/2018 Document Reviewed: 09/13/2018 Elsevier Patient Education  [place].

## 2019-09-07 ENCOUNTER — Other Ambulatory Visit: Payer: Self-pay

## 2019-09-08 ENCOUNTER — Other Ambulatory Visit: Payer: Self-pay

## 2019-09-09 MED ORDER — AMPHETAMINE-DEXTROAMPHETAMINE 20 MG PO TABS: ORAL_TABLET | ORAL | 0 refills | Status: DC

## 2019-09-18 MED ORDER — AZITHROMYCIN 250 MG PO TABS: ORAL_TABLET | ORAL | 1 refills | Status: AC

## 2019-09-30 ENCOUNTER — Other Ambulatory Visit: Payer: Self-pay

## 2019-09-30 DIAGNOSIS — F411 Generalized anxiety disorder: Secondary | ICD-10-CM

## 2019-09-30 MED ORDER — ALPRAZOLAM 0.5 MG PO TABS: 0.5000 mg | ORAL_TABLET | Freq: Two times a day (BID) | ORAL | 0 refills | Status: DC | PRN

## 2019-10-08 ENCOUNTER — Ambulatory Visit: Payer: Medicaid Other | Admitting: Physician Assistant

## 2019-10-13 ENCOUNTER — Other Ambulatory Visit: Payer: Self-pay

## 2019-10-14 ENCOUNTER — Other Ambulatory Visit: Payer: Self-pay

## 2019-10-15 MED ORDER — AMPHETAMINE-DEXTROAMPHETAMINE 20 MG PO TABS: ORAL_TABLET | ORAL | 0 refills | Status: DC

## 2019-11-18 ENCOUNTER — Other Ambulatory Visit: Payer: Self-pay

## 2019-11-18 DIAGNOSIS — F411 Generalized anxiety disorder: Secondary | ICD-10-CM

## 2019-11-18 MED ORDER — AMPHETAMINE-DEXTROAMPHETAMINE 20 MG PO TABS: ORAL_TABLET | ORAL | 0 refills | Status: DC

## 2019-11-18 MED ORDER — ALPRAZOLAM 0.5 MG PO TABS: 0.5000 mg | ORAL_TABLET | Freq: Two times a day (BID) | ORAL | 0 refills | Status: DC | PRN

## 2019-11-19 NOTE — Progress Notes (Deleted)
FOLLOW UP  Assessment and Plan:   Recurrent major depressive disorder, in partial remission ([place]) -  stress management techniques discussed, increase water, good sleep hygiene discussed, increase exercise, and increase veggies.  - rare use of xanax  Tobacco use disorder -  instruction/counseling given, counseled patient on the dangers of tobacco use, advised patient to stop smoking, and reviewed strategies to maximize success, patient not ready to quit at this time.   Gastroesophageal reflux disease without esophagitis Continue PPI/H2 blocker, diet discussed  Uncomplicated asthma, unspecified asthma severity, unspecified whether persistent Avoid triggers, controlled at this time  Attention deficit disorder, unspecified hyperactivity presence -  Continue ADD medication, helps with focus, no AE's. The patient was counseled on the addictive nature of the medication and was encouraged to take drug holidays when not needed.   Hyperlipidemia, unspecified hyperlipidemia type - Mild elevations managed by lifestyle  -check lipids, decrease fatty foods, increase activity.   Vitamin D deficiency Near goal at recent check; continue to recommend supplementation for goal of 60-100 Defer vitamin D level due to no insurance  Declines all labs today; no insurance currently; check labs next OV  Continue diet and meds as discussed. Further disposition pending results of labs. Discussed med's effects and SE's.   Over 30 minutes of exam, counseling, chart review, and critical decision making was performed.   Future Appointments  Date Time Provider [place]  11/20/2019 11:00 AM Vicie Mutters, PA-C [facility] None  02/09/2020  8:45 AM Vicie Mutters, PA-C [facility] None    ----------------------------------------------------------------------------------------------------------------------  HPI 49 y.o. female  presents for 6 month follow up on cholesterol, anxiety, ADD, smoking and  vitamin D deficiency.   No insurance currently; hoping to have insurance by next visit in 6 months.   She has had stress fighting with her ex.  she has a diagnosis of anxiety and is currently on PRN xanax, reports symptoms are well controlled on current regimen. she currently uses rarely, typically when in court, etc. Averages 0-2 tabs. Last filled 05/26/2019 per PDMP review  Patient is on an ADD medication, she states that the medication is helping and she denies any adverse reactions. She currently takes 1-2 tabs on days that she works, Mon-Fri or on sick days.   she currently vapes daily; discussed risks associated with smoking, patient is not ready to quit. She has cut down on mg of nicotine but doesn't want to quit.   She has GERD well controlled recently, takes omperazole PRN, only needing once a week or so.   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   Wt Readings from Last 3 Encounters:  08/11/19 138 lb 9.6 oz (62.9 kg)  02/05/19 140 lb (63.5 kg)  03/22/18 141 lb (64 kg)   Today their BP is    She does workout. She denies chest pain, shortness of breath, dizziness.   She is not on cholesterol medication. Her cholesterol is not at goal. The cholesterol last visit was:   Lab Results  Component Value Date   CHOL 247 (H) 03/22/2018   HDL 115 03/22/2018   LDLCALC 118 (H) 03/22/2018   TRIG 50 03/22/2018   CHOLHDL 2.1 03/22/2018   Patient is on Vitamin D supplement, 10000 IU daily   Lab Results  Component Value Date   VD25OH 53 03/22/2018        Current  Medications:  Current Outpatient Medications on File Prior to Visit  Medication Sig  . acyclovir (ZOVIRAX) 400 MG tablet Take 1 pill 3 times daily at start on cold sore (Patient taking differently: as needed. Take 1 pill 3 times daily at start on cold sore)  . ALPRAZolam (XANAX) 0.5 MG tablet Take 1  tablet (0.5 mg total) by mouth 2 (two) times daily as needed. Limit to <5 days per week to avoid addiction/tolerance.  Marland Kitchen amphetamine-dextroamphetamine (ADDERALL) 20 MG tablet Take 1/2-1 tablet 1-2 x /day ONLY as needed for ADD  & please try to limit to 5 days /week to avoid addiction. Should last much longer than 30 days.  . CHOLECALCIFEROL PO Take 10,000 Units by mouth daily.  . Loratadine-Pseudoephedrine (CLARITIN-D 24 HOUR PO) Take by mouth daily.  Marland Kitchen OMEPRAZOLE PO Take by mouth as needed.    No current facility-administered medications on file prior to visit.     Allergies:  Allergies  Allergen Reactions  . Codeine Nausea And Vomiting  . Penicillins Nausea And Vomiting     Medical History:  Past Medical History:  Diagnosis Date  . ADD (attention deficit disorder)   . Anemia   . Asthma   . Depression   . GERD (gastroesophageal reflux disease)    Family history- Reviewed and unchanged Social history- Reviewed and unchanged   Review of Systems:  Review of Systems  Constitutional: Negative for malaise/fatigue and weight loss.  HENT: Negative for hearing loss and tinnitus.   Eyes: Negative for blurred vision and double vision.  Respiratory: Negative for cough, shortness of breath and wheezing.   Cardiovascular: Negative for chest pain, palpitations, orthopnea, claudication and leg swelling.  Gastrointestinal: Negative for abdominal pain, blood in stool, constipation, diarrhea, heartburn, melena, nausea and vomiting.  Genitourinary: Negative.   Musculoskeletal: Negative for joint pain and myalgias.  Skin: Negative for rash.  Neurological: Negative for dizziness, tingling, sensory change, weakness and headaches.  Endo/Heme/Allergies: Negative for polydipsia.  Psychiatric/Behavioral: Negative.   All other systems reviewed and are negative.     Physical Exam: There were no vitals taken for this visit. Wt Readings from Last 3 Encounters:  08/11/19 138 lb 9.6 oz (62.9 kg)   02/05/19 140 lb (63.5 kg)  03/22/18 141 lb (64 kg)   General Appearance: Well nourished, in no apparent distress. Eyes: PERRLA, EOMs, conjunctiva no swelling or erythema Sinuses: No Frontal/maxillary tenderness ENT/Mouth: Ext aud canals clear, TMs without erythema, bulging. No erythema, swelling, or exudate on post pharynx.  Tonsils not swollen or erythematous. Hearing normal.  Neck: Supple, thyroid normal.  Respiratory: Respiratory effort normal, BS equal bilaterally without rales, rhonchi, wheezing or stridor.  Cardio: RRR with no MRGs. Brisk peripheral pulses without edema.  Abdomen: Soft, + BS.  Non tender, no guarding, rebound, hernias, masses. Lymphatics: Non tender without lymphadenopathy.  Musculoskeletal: Full ROM, 5/5 strength, Normal gait Skin: Warm, dry without rashes, lesions, ecchymosis.  Neuro: Cranial nerves intact. No cerebellar symptoms.  Psych: Awake and oriented X 3, normal affect, Insight and Judgment appropriate.    Quentin Mulling, PA-C 1:52 PM [facility]

## 2019-11-20 ENCOUNTER — Ambulatory Visit: Payer: Medicaid Other | Admitting: Physician Assistant

## 2019-12-05 ENCOUNTER — Ambulatory Visit (INDEPENDENT_AMBULATORY_CARE_PROVIDER_SITE_OTHER): Payer: Self-pay | Admitting: Physician Assistant

## 2019-12-05 ENCOUNTER — Other Ambulatory Visit: Payer: Self-pay

## 2019-12-05 ENCOUNTER — Encounter: Payer: Self-pay | Admitting: Physician Assistant

## 2019-12-05 VITALS — BP 126/74 | HR 69 | Temp 97.5°F | Wt 144.4 lb

## 2019-12-05 DIAGNOSIS — M2550 Pain in unspecified joint: Secondary | ICD-10-CM

## 2019-12-05 DIAGNOSIS — K121 Other forms of stomatitis: Secondary | ICD-10-CM

## 2019-12-05 DIAGNOSIS — R21 Rash and other nonspecific skin eruption: Secondary | ICD-10-CM

## 2019-12-05 MED ORDER — TRIAMCINOLONE ACETONIDE 0.5 % EX CREA: 1.0000 "application " | TOPICAL_CREAM | Freq: Two times a day (BID) | CUTANEOUS | 2 refills | Status: AC

## 2019-12-05 MED ORDER — KETOCONAZOLE 2 % EX CREA: 1.0000 "application " | TOPICAL_CREAM | Freq: Two times a day (BID) | CUTANEOUS | 0 refills | Status: DC

## 2019-12-05 NOTE — Patient Instructions (Addendum)
Will check labs Put vaseline on lips, try to avoid mask Will give you ketoconazole cream use this twice a day The triamcinolone cream- use minimal and only once a day    Gallbladder Eating Plan If you have a gallbladder condition, you may have trouble digesting fats. Eating a low-fat diet can help reduce your symptoms, and may be helpful before and after having surgery to remove your gallbladder (cholecystectomy). Your health care provider may recommend that you work with a diet and nutrition specialist (dietitian) to help you reduce the amount of fat in your diet. What are tips for following this plan? General guidelines  Limit your fat intake to less than 30% of your total daily calories. If you eat around 1,800 calories each day, this is less than 60 grams (g) of fat per day.  Fat is an important part of a healthy diet. Eating a low-fat diet can make it hard to maintain a healthy body weight. Ask your dietitian how much fat, calories, and other nutrients you need each day.  Eat small, frequent meals throughout the day instead of three large meals.  Drink at least 8-10 cups of fluid a day. Drink enough fluid to keep your urine clear or pale yellow.  Limit alcohol intake to no more than 1 drink a day for nonpregnant women and 2 drinks a day for men. One drink equals 12 oz of beer, 5 oz of wine, or 1 oz of hard liquor. Reading food labels  Check Nutrition Facts on food labels for the amount of fat per serving. Choose foods with less than 3 grams of fat per serving. Shopping  Choose nonfat and low-fat healthy foods. Look for the words "nonfat," "low fat," or "fat free."  Avoid buying processed or prepackaged foods. Cooking  Cook using low-fat methods, such as baking, broiling, grilling, or boiling.  Cook with small amounts of healthy fats, such as olive oil, grapeseed oil, canola oil, or sunflower oil. What foods are recommended?   All fresh, frozen, or canned fruits and  vegetables.  Whole grains.  Low-fat or non-fat (skim) milk and yogurt.  Lean meat, skinless poultry, fish, eggs, and beans.  Low-fat protein supplement powders or drinks.  Spices and herbs. What foods are not recommended?  High-fat foods. These include baked goods, fast food, fatty cuts of meat, ice cream, french toast, sweet rolls, pizza, cheese bread, foods covered with butter, creamy sauces, or cheese.  Fried foods. These include french fries, tempura, battered fish, breaded chicken, fried breads, and sweets.  Foods with strong odors.  Foods that cause bloating and gas. Summary  A low-fat diet can be helpful if you have a gallbladder condition, or before and after gallbladder surgery.  Limit your fat intake to less than 30% of your total daily calories. This is about 60 g of fat if you eat 1,800 calories each day.  Eat small, frequent meals throughout the day instead of three large meals. This information is not intended to replace advice given to you by your health care provider. Make sure you discuss any questions you have with your health care provider. Document Revised: 10/24/2018 Document Reviewed: 08/10/2016 Elsevier Patient Education  2020 [employer].
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
## 2019-12-09 LAB — COMPLETE METABOLIC PANEL WITH GFR
AG Ratio: 1.8 (calc) (ref 1.0–2.5)
ALT: 5 U/L — ABNORMAL LOW (ref 6–29)
AST: 16 U/L (ref 10–35)
Albumin: 4.5 g/dL (ref 3.6–5.1)
Alkaline phosphatase (APISO): 53 U/L (ref 31–125)
BUN: 9 mg/dL (ref 7–25)
CO2: 26 mmol/L (ref 20–32)
Calcium: 9.2 mg/dL (ref 8.6–10.2)
Chloride: 102 mmol/L (ref 98–110)
Creat: 0.6 mg/dL (ref 0.50–1.10)
GFR, Est African American: 125 mL/min/{1.73_m2} (ref >=60)
GFR, Est Non African American: 108 mL/min/{1.73_m2} (ref >=60)
Globulin: 2.5 g/dL (calc) (ref 1.9–3.7)
Glucose, Bld: 93 mg/dL (ref 65–99)
Potassium: 4.7 mmol/L (ref 3.5–5.3)
Sodium: 138 mmol/L (ref 135–146)
Total Bilirubin: 0.4 mg/dL (ref 0.2–1.2)
Total Protein: 7 g/dL (ref 6.1–8.1)

## 2019-12-09 LAB — CBC WITH DIFFERENTIAL/PLATELET
Absolute Monocytes: 621 cells/uL (ref 200–950)
Basophils Absolute: 59 cells/uL (ref 0–200)
Basophils Relative: 1.1 %
Eosinophils Absolute: 103 cells/uL (ref 15–500)
Eosinophils Relative: 1.9 %
HCT: 42.1 % (ref 35.0–45.0)
Hemoglobin: 13.5 g/dL (ref 11.7–15.5)
Lymphs Abs: 1577 cells/uL (ref 850–3900)
MCH: 29 pg (ref 27.0–33.0)
MCHC: 32.1 g/dL (ref 32.0–36.0)
MCV: 90.3 fL (ref 80.0–100.0)
MPV: 9.6 fL (ref 7.5–12.5)
Monocytes Relative: 11.5 %
Neutro Abs: 3040 cells/uL (ref 1500–7800)
Neutrophils Relative %: 56.3 %
Platelets: 280 10*3/uL (ref 140–400)
RBC: 4.66 10*6/uL (ref 3.80–5.10)
RDW: 13.1 % (ref 11.0–15.0)
Total Lymphocyte: 29.2 %
WBC: 5.4 10*3/uL (ref 3.8–10.8)

## 2019-12-09 LAB — SEDIMENTATION RATE

## 2019-12-09 LAB — ANTI-DNA ANTIBODY, DOUBLE-STRANDED: ds DNA Ab: 4 IU/mL

## 2019-12-09 LAB — ANTI-NUCLEAR AB-TITER (ANA TITER): ANA Titer 1: 1:80 {titer} — ABNORMAL HIGH

## 2019-12-09 LAB — SED RATE MANUAL WEST RFLX: SED RATE BY MODIFIED WESTERGREN,MANUAL: 10 mm/h (ref 0–20)

## 2019-12-09 LAB — RHEUMATOID FACTOR: Rheumatoid fact SerPl-aCnc: <14 IU/mL (ref <14)

## 2019-12-09 LAB — ANA: Anti Nuclear Antibody (ANA): POSITIVE — AB

## 2019-12-22 ENCOUNTER — Other Ambulatory Visit: Payer: Self-pay

## 2019-12-22 MED ORDER — AMPHETAMINE-DEXTROAMPHETAMINE 20 MG PO TABS: ORAL_TABLET | ORAL | 0 refills | Status: DC

## 2020-01-24 ENCOUNTER — Other Ambulatory Visit: Payer: Self-pay

## 2020-01-24 DIAGNOSIS — F411 Generalized anxiety disorder: Secondary | ICD-10-CM

## 2020-01-25 MED ORDER — ALPRAZOLAM 0.5 MG PO TABS: 0.5000 mg | ORAL_TABLET | Freq: Two times a day (BID) | ORAL | 0 refills | Status: DC | PRN

## 2020-01-25 MED ORDER — AMPHETAMINE-DEXTROAMPHETAMINE 20 MG PO TABS: ORAL_TABLET | ORAL | 0 refills | Status: DC

## 2020-01-26 MED ORDER — PREDNISONE 20 MG PO TABS: ORAL_TABLET | ORAL | 0 refills | Status: DC

## 2020-02-04 NOTE — Progress Notes (Signed)
FOLLOW UP  Assessment and Plan:   Rash/polyartharlgia Had negative RF, antismith, and ESR Had low titer positive ANA at 1:80.  Had recent flare and states prednisone resolved 100% and she felt great. When patient gets insurance may refer to rheumatologist for evaluation and further labs- declines at this time.   Recurrent major depressive disorder, in partial remission ([place]) -  stress management techniques discussed, increase water, good sleep hygiene discussed, increase exercise, and increase veggies.  - rare use of xanax, using melatonin at night  Tobacco use disorder -  instruction/counseling given, counseled patient on the dangers of tobacco use, advised patient to stop smoking, and reviewed strategies to maximize success, patient not ready to quit at this time.   Gastroesophageal reflux disease without esophagitis Continue PPI/H2 blocker, diet discussed  Uncomplicated asthma, unspecified asthma severity, unspecified whether persistent Avoid triggers, controlled at this time  Attention deficit disorder, unspecified hyperactivity presence -  Continue ADD medication, helps with focus, no AE's. The patient was counseled on the addictive nature of the medication and was encouraged to take drug holidays when not needed.   Hyperlipidemia, unspecified hyperlipidemia type - Mild elevations managed by lifestyle  -check lipids next OV, decrease fatty foods, increase activity.   Vitamin D deficiency Near goal at recent check; continue to recommend supplementation for goal of 60-100 Defer vitamin D level due to no insurance  Declines all labs today; no insurance currently; check labs next OV  Continue diet and meds as discussed. Further disposition pending results of labs. Discussed med's effects and SE's.   Over 30 minutes of exam, counseling, chart review, and critical decision making was performed.   No future  appointments.  ----------------------------------------------------------------------------------------------------------------------  HPI 49 y.o. female  presents for 6 month follow up on cholesterol, anxiety, ADD, smoking and vitamin D deficiency.   Patient does not have insurance. She was seen 05/21 for polyarthralgia, rash, nail changes, mouth sores- she was checked sed rate, AntiDNA, RF which were negative. She had a positive ANA but with low titer. She was given prednisone on 07/12 for a "flare of her symptoms" and states she felt the best that she has with a resolution of her symptoms. She would like to see a rheumatologist when she gets insurance.   No insurance currently; hoping to have insurance by next visit in 6 months with possible new job.    She has had stress fighting with her ex.  she has a diagnosis of anxiety and is currently on PRN xanax, reports symptoms are well controlled on current regimen. she currently uses rarely, typically when in court, etc. Averages 0-2 tabs. Last filled 05/26/2019 per PDMP review  Patient is on an ADD medication, she states that the medication is helping and she denies any adverse reactions. She currently takes 1-2 tabs on days that she works, Mon-Fri or on sick days.   she currently vapes daily; discussed risks associated with smoking, patient is not ready to quit. She has cut down on mg of nicotine and trying to stop due to mouth sores.   She has GERD well controlled recently, takes omperazole PRN, only needing once a week or so.   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   Wt Readings from Last 3 Encounters:  02/09/20 148 lb (67.1 kg)  12/05/19 144 lb 6.4 oz (65.5 kg)  08/11/19 138 lb 9.6 oz (62.9 kg)  Today their BP is BP: 124/76  She does workout. She denies chest pain, shortness of breath, dizziness.   She is not on cholesterol  medication. Her cholesterol is not at goal. The cholesterol last visit was:   Lab Results  Component Value Date   CHOL 247 (H) 03/22/2018   HDL 115 03/22/2018   LDLCALC 118 (H) 03/22/2018   TRIG 50 03/22/2018   CHOLHDL 2.1 03/22/2018   Patient is on Vitamin D supplement, 10000 IU daily   Lab Results  Component Value Date   VD25OH 53 03/22/2018        Current Medications:     Current Outpatient Medications (Respiratory):  Marland Kitchen  Loratadine-Pseudoephedrine (CLARITIN-D 24 HOUR PO), Take by mouth daily.    Current Outpatient Medications (Other):  .  acyclovir (ZOVIRAX) 400 MG tablet, Take 1 pill 3 times daily at start on cold sore (Patient taking differently: as needed. Take 1 pill 3 times daily at start on cold sore) .  ALPRAZolam (XANAX) 0.5 MG tablet, Take 1 tablet (0.5 mg total) by mouth 2 (two) times daily as needed. Limit to <5 days per week to avoid addiction/tolerance. Marland Kitchen  amphetamine-dextroamphetamine (ADDERALL) 20 MG tablet, Take 1/2-1 tablet 1-2 x /day ONLY as needed for ADD  & please try to limit to 5 days /week to avoid addiction. Should last much longer than 30 days. .  CHOLECALCIFEROL PO, Take 10,000 Units by mouth daily. Marland Kitchen  ketoconazole (NIZORAL) 2 % cream, Apply 1 application topically 2 (two) times daily. Marland Kitchen  OMEPRAZOLE PO, Take by mouth as needed.  .  triamcinolone cream (KENALOG) 0.5 %, Apply 1 application topically 2 (two) times daily.   Allergies:  Allergies  Allergen Reactions  . Codeine Nausea And Vomiting  . Penicillins Nausea And Vomiting     Medical History:  Past Medical History:  Diagnosis Date  . ADD (attention deficit disorder)   . Anemia   . Asthma   . Depression   . GERD (gastroesophageal reflux disease)    Family history- Reviewed and unchanged Social history- Reviewed and unchanged   Review of Systems:  Review of Systems  Constitutional: Negative for malaise/fatigue and weight loss.  HENT: Negative for hearing loss and tinnitus.    Eyes: Negative for blurred vision and double vision.  Respiratory: Negative for cough, shortness of breath and wheezing.   Cardiovascular: Negative for chest pain, palpitations, orthopnea, claudication and leg swelling.  Gastrointestinal: Negative for abdominal pain, blood in stool, constipation, diarrhea, heartburn, melena, nausea and vomiting.  Genitourinary: Negative.   Musculoskeletal: Negative for joint pain and myalgias.  Skin: Negative for rash.  Neurological: Negative for dizziness, tingling, sensory change, weakness and headaches.  Endo/Heme/Allergies: Negative for polydipsia.  Psychiatric/Behavioral: Negative.   All other systems reviewed and are negative.   Physical Exam: BP 124/76   Pulse 86   Temp 97.6 F (36.4 C)   Wt 148 lb (67.1 kg)   SpO2 98%   BMI 25.01 kg/m  Wt Readings from Last 3 Encounters:  02/09/20 148 lb (67.1 kg)  12/05/19 144 lb 6.4 oz (65.5 kg)  08/11/19 138 lb 9.6 oz (62.9 kg)   General Appearance: Well nourished, in no apparent distress. Eyes: PERRLA, EOMs, conjunctiva no swelling or erythema Sinuses: No Frontal/maxillary tenderness ENT/Mouth: Ext aud canals clear, TMs without erythema, bulging. No erythema, swelling, or exudate on post pharynx.  Tonsils not swollen or erythematous. Hearing normal.  Neck: Supple, thyroid normal.  Respiratory: Respiratory effort normal, BS equal bilaterally without  rales, rhonchi, wheezing or stridor.  Cardio: RRR with no MRGs. Brisk peripheral pulses without edema.  Abdomen: Soft, + BS.  Non tender, no guarding, rebound, hernias, masses. Lymphatics: Non tender without lymphadenopathy.  Musculoskeletal: Full ROM, 5/5 strength, Normal gait Skin: Warm, dry without rashes, lesions, ecchymosis.  Neuro: Cranial nerves intact. No cerebellar symptoms.  Psych: Awake and oriented X 3, normal affect, Insight and Judgment appropriate.    Vicie Mutters, PA-C 9:11 AM [facility]

## 2020-02-09 ENCOUNTER — Encounter: Payer: Self-pay | Admitting: Physician Assistant

## 2020-02-09 ENCOUNTER — Ambulatory Visit (INDEPENDENT_AMBULATORY_CARE_PROVIDER_SITE_OTHER): Payer: Self-pay | Admitting: Physician Assistant

## 2020-02-09 ENCOUNTER — Other Ambulatory Visit: Payer: Self-pay

## 2020-02-09 VITALS — BP 124/76 | HR 86 | Temp 97.6°F | Wt 148.0 lb

## 2020-02-09 DIAGNOSIS — J45909 Unspecified asthma, uncomplicated: Secondary | ICD-10-CM

## 2020-02-09 DIAGNOSIS — F988 Other specified behavioral and emotional disorders with onset usually occurring in childhood and adolescence: Secondary | ICD-10-CM

## 2020-02-09 DIAGNOSIS — K219 Gastro-esophageal reflux disease without esophagitis: Secondary | ICD-10-CM

## 2020-02-09 DIAGNOSIS — F17200 Nicotine dependence, unspecified, uncomplicated: Secondary | ICD-10-CM

## 2020-02-09 DIAGNOSIS — E782 Mixed hyperlipidemia: Secondary | ICD-10-CM

## 2020-02-09 DIAGNOSIS — M2550 Pain in unspecified joint: Secondary | ICD-10-CM

## 2020-02-09 DIAGNOSIS — F3341 Major depressive disorder, recurrent, in partial remission: Secondary | ICD-10-CM

## 2020-02-09 MED ORDER — PREDNISONE 20 MG PO TABS: ORAL_TABLET | ORAL | 0 refills | Status: AC

## 2020-02-09 NOTE — Patient Instructions (Addendum)
  Please take the prednisone as directed below, this is NOT an antibiotic so you do NOT have to finish it. You can take it for a few days and stop it if you are doing better.   Please take the prednisone to help decrease inflammation and therefore decrease symptoms. Take it it with food to avoid GI upset. It can cause increased energy but on the other hand it can make it hard to sleep at night so please take it AT NIGHT WITH DINNER, it takes 8-12 hours to start working so it will NOT affect your sleeping if you take it at night with your food!!  If you are diabetic it will increase your sugars so decrease carbs and monitor your sugars closely.     Know what a healthy weight is for you (roughly BMI <25) and aim to maintain this  Aim for 7+ servings of fruits and vegetables daily  65-80+ fluid ounces of water or unsweet tea for healthy kidneys  Limit to max 1 drink of alcohol per day; avoid smoking/tobacco  Limit animal fats in diet for cholesterol and heart health - choose grass fed whenever available  Avoid highly processed foods, and foods high in saturated/trans fats  Aim for low stress - take time to unwind and care for your mental health  Aim for 150 min of moderate intensity exercise weekly for heart health, and weights twice weekly for bone health  Aim for 7-9 hours of sleep daily

## 2020-02-26 ENCOUNTER — Other Ambulatory Visit: Payer: Self-pay

## 2020-02-27 ENCOUNTER — Other Ambulatory Visit: Payer: Self-pay

## 2020-02-29 MED ORDER — AMPHETAMINE-DEXTROAMPHETAMINE 20 MG PO TABS: ORAL_TABLET | ORAL | 0 refills | Status: DC

## 2020-03-29 ENCOUNTER — Other Ambulatory Visit: Payer: Self-pay

## 2020-03-29 DIAGNOSIS — F411 Generalized anxiety disorder: Secondary | ICD-10-CM

## 2020-03-29 MED ORDER — ALPRAZOLAM 0.5 MG PO TABS: 0.5000 mg | ORAL_TABLET | Freq: Two times a day (BID) | ORAL | 0 refills | Status: DC | PRN

## 2020-03-31 ENCOUNTER — Other Ambulatory Visit: Payer: Self-pay

## 2020-04-02 MED ORDER — AMPHETAMINE-DEXTROAMPHETAMINE 20 MG PO TABS: ORAL_TABLET | ORAL | 0 refills | Status: DC

## 2020-04-05 MED ORDER — PREDNISONE 20 MG PO TABS: ORAL_TABLET | ORAL | 0 refills | Status: AC

## 2020-05-14 NOTE — Progress Notes (Deleted)
FOLLOW UP  Assessment and Plan:   Rash/polyartharlgia Had negative RF, antismith, and ESR Had low titer positive ANA at 1:80.  Had recent flare and states prednisone resolved 100% and she felt great. When patient gets insurance may refer to rheumatologist for evaluation and further labs- declines at this time.   Recurrent major depressive disorder, in partial remission ([place]) -  stress management techniques discussed, increase water, good sleep hygiene discussed, increase exercise, and increase veggies.  - rare use of xanax, using melatonin at night  Tobacco use disorder -  instruction/counseling given, counseled patient on the dangers of tobacco use, advised patient to stop smoking, and reviewed strategies to maximize success, patient not ready to quit at this time.   Gastroesophageal reflux disease without esophagitis Continue PPI/H2 blocker, diet discussed  Uncomplicated asthma, unspecified asthma severity, unspecified whether persistent Avoid triggers, controlled at this time  Attention deficit disorder, unspecified hyperactivity presence -  Continue ADD medication, helps with focus, no AE's. The patient was counseled on the addictive nature of the medication and was encouraged to take drug holidays when not needed.   Hyperlipidemia, unspecified hyperlipidemia type - Mild elevations managed by lifestyle  -check lipids next OV, decrease fatty foods, increase activity.   Vitamin D deficiency Near goal at recent check; continue to recommend supplementation for goal of 60-100 Defer vitamin D level due to no insurance  Declines all labs today; no insurance currently; check labs next OV ***  Continue diet and meds as discussed. Further disposition pending results of labs. Discussed med's effects and SE's.   Over 30 minutes of exam, counseling, chart review, and critical decision making was performed.   Future Appointments  Date Time Provider [place]  05/17/2020  8:45  AM Liane Comber, NP [facility] None    ----------------------------------------------------------------------------------------------------------------------  HPI 49 y.o. female  presents for 6 month follow up on cholesterol, anxiety, ADD, smoking and vitamin D deficiency.    No insurance currently; hoping to have insurance by next visit in 6 months with possible new job.    She was seen 05/21 for polyarthralgia, rash, nail changes, mouth sores - sed rate, AntiDNA, RF were negative. She had a positive ANA but with low titer. She was given prednisone on 07/12 for a "flare of her symptoms" and states she felt the best that she has with a resolution of her symptoms. She would like to see a rheumatologist when she gets insurance.   she has a diagnosis of anxiety and is currently on PRN xanax, reports symptoms are well controlled on current regimen. She has had stress fighting with her ex.  She was using rarely, typically when in court, etc. Averages 0-2 tabs. *** ask *** she is filling 60 tabs every 60 days or so *** needs daily agent or something for sleep?   Patient is on an ADD medication, she states that the medication is helping and she denies any adverse reactions. She currently takes 1-2 tabs on days that she works, Mon-Fri.   she currently vapes daily; discussed risks associated with smoking, patient is not ready to quit. She has cut down on mg of nicotine and trying to stop due to mouth sores.   She has GERD well controlled recently, takes omperazole PRN, only needing once a week or so.   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  Wt Readings from Last 3 Encounters:  02/09/20 148 lb (67.1 kg)  12/05/19 144 lb 6.4 oz (65.5 kg)  08/11/19 138 lb 9.6 oz (62.9 kg)   Today their BP is    She does workout. She denies chest pain, shortness of breath, dizziness.    She is not on cholesterol medication. Her cholesterol is not at goal. The cholesterol last visit was:   Lab Results  Component Value Date   CHOL 247 (H) 03/22/2018   HDL 115 03/22/2018   LDLCALC 118 (H) 03/22/2018   TRIG 50 03/22/2018   CHOLHDL 2.1 03/22/2018   Patient is on Vitamin D supplement, 10000 IU daily   Lab Results  Component Value Date   VD25OH 53 03/22/2018        Current Medications:     Current Outpatient Medications (Respiratory):  Marland Kitchen  Loratadine-Pseudoephedrine (CLARITIN-D 24 HOUR PO), Take by mouth daily.    Current Outpatient Medications (Other):  .  acyclovir (ZOVIRAX) 400 MG tablet, Take 1 pill 3 times daily at start on cold sore (Patient taking differently: as needed. Take 1 pill 3 times daily at start on cold sore) .  ALPRAZolam (XANAX) 0.5 MG tablet, Take 1 tablet (0.5 mg total) by mouth 2 (two) times daily as needed. Limit to <5 days per week to avoid addiction/tolerance. Marland Kitchen  amphetamine-dextroamphetamine (ADDERALL) 20 MG tablet, Take 1/2-1 tablet 1-2 x /day ONLY as needed for ADD  & please try to limit to 5 days /week to avoid addiction. Should last much longer than 30 days. .  CHOLECALCIFEROL PO, Take 10,000 Units by mouth daily. Marland Kitchen  ketoconazole (NIZORAL) 2 % cream, Apply 1 application topically 2 (two) times daily. Marland Kitchen  OMEPRAZOLE PO, Take by mouth as needed.  .  triamcinolone cream (KENALOG) 0.5 %, Apply 1 application topically 2 (two) times daily.   Allergies:  Allergies  Allergen Reactions  . Codeine Nausea And Vomiting  . Penicillins Nausea And Vomiting     Medical History:  Past Medical History:  Diagnosis Date  . ADD (attention deficit disorder)   . Anemia   . Asthma   . Depression   . GERD (gastroesophageal reflux disease)    Family history- Reviewed and unchanged Social history- Reviewed and unchanged   Review of Systems:  Review of Systems  Constitutional: Negative for malaise/fatigue and weight loss.  HENT: Negative for  hearing loss and tinnitus.   Eyes: Negative for blurred vision and double vision.  Respiratory: Negative for cough, shortness of breath and wheezing.   Cardiovascular: Negative for chest pain, palpitations, orthopnea, claudication and leg swelling.  Gastrointestinal: Negative for abdominal pain, blood in stool, constipation, diarrhea, heartburn, melena, nausea and vomiting.  Genitourinary: Negative.   Musculoskeletal: Negative for joint pain and myalgias.  Skin: Negative for rash.  Neurological: Negative for dizziness, tingling, sensory change, weakness and headaches.  Endo/Heme/Allergies: Negative for polydipsia.  Psychiatric/Behavioral: Negative.   All other systems reviewed and are negative.   Physical Exam: There were no vitals taken for this visit. Wt Readings from Last 3 Encounters:  02/09/20 148 lb (67.1 kg)  12/05/19 144 lb 6.4 oz (65.5 kg)  08/11/19 138 lb 9.6 oz (62.9 kg)   General Appearance: Well nourished, in no apparent distress. Eyes: PERRLA, EOMs, conjunctiva no swelling or erythema Sinuses: No Frontal/maxillary tenderness ENT/Mouth: Ext aud canals clear, TMs without erythema, bulging. No erythema, swelling, or exudate on post pharynx.  Tonsils not swollen or erythematous. Hearing normal.  Neck: Supple, thyroid normal.  Respiratory: Respiratory effort normal, BS equal bilaterally without rales, rhonchi, wheezing or stridor.  Cardio: RRR with no MRGs. Brisk peripheral pulses without edema.  Abdomen: Soft, + BS.  Non tender, no guarding, rebound, hernias, masses. Lymphatics: Non tender without lymphadenopathy.  Musculoskeletal: Full ROM, 5/5 strength, Normal gait Skin: Warm, dry without rashes, lesions, ecchymosis.  Neuro: Cranial nerves intact. No cerebellar symptoms.  Psych: Awake and oriented X 3, normal affect, Insight and Judgment appropriate.    Izora Ribas, NP 8:20 AM [facility]

## 2020-05-17 ENCOUNTER — Ambulatory Visit: Payer: Self-pay | Admitting: Adult Health

## 2020-05-17 DIAGNOSIS — K219 Gastro-esophageal reflux disease without esophagitis: Secondary | ICD-10-CM

## 2020-05-17 DIAGNOSIS — E782 Mixed hyperlipidemia: Secondary | ICD-10-CM

## 2020-05-17 DIAGNOSIS — F3341 Major depressive disorder, recurrent, in partial remission: Secondary | ICD-10-CM

## 2020-05-17 DIAGNOSIS — F988 Other specified behavioral and emotional disorders with onset usually occurring in childhood and adolescence: Secondary | ICD-10-CM

## 2020-05-19 ENCOUNTER — Other Ambulatory Visit: Payer: Self-pay | Admitting: Adult Health

## 2020-05-19 MED ORDER — AZITHROMYCIN 250 MG PO TABS
ORAL_TABLET | ORAL | 1 refills | Status: DC
Start: 2020-05-19 — End: 2020-05-24

## 2020-05-19 MED ORDER — PREDNISONE 20 MG PO TABS
ORAL_TABLET | ORAL | 0 refills | Status: DC
Start: 2020-05-19 — End: 2020-05-24

## 2020-05-24 ENCOUNTER — Encounter: Payer: Self-pay | Admitting: Adult Health Nurse Practitioner

## 2020-05-24 ENCOUNTER — Other Ambulatory Visit: Payer: Self-pay

## 2020-05-24 ENCOUNTER — Ambulatory Visit (INDEPENDENT_AMBULATORY_CARE_PROVIDER_SITE_OTHER): Payer: Self-pay | Admitting: Adult Health Nurse Practitioner

## 2020-05-24 VITALS — BP 122/80 | HR 67 | Temp 97.7°F | Wt 158.0 lb

## 2020-05-24 DIAGNOSIS — F3341 Major depressive disorder, recurrent, in partial remission: Secondary | ICD-10-CM

## 2020-05-24 DIAGNOSIS — F988 Other specified behavioral and emotional disorders with onset usually occurring in childhood and adolescence: Secondary | ICD-10-CM

## 2020-05-24 DIAGNOSIS — J45909 Unspecified asthma, uncomplicated: Secondary | ICD-10-CM

## 2020-05-24 DIAGNOSIS — F17200 Nicotine dependence, unspecified, uncomplicated: Secondary | ICD-10-CM

## 2020-05-24 DIAGNOSIS — E559 Vitamin D deficiency, unspecified: Secondary | ICD-10-CM

## 2020-05-24 DIAGNOSIS — M2550 Pain in unspecified joint: Secondary | ICD-10-CM

## 2020-05-24 DIAGNOSIS — K219 Gastro-esophageal reflux disease without esophagitis: Secondary | ICD-10-CM

## 2020-05-24 DIAGNOSIS — B373 Candidiasis of vulva and vagina: Secondary | ICD-10-CM

## 2020-05-24 DIAGNOSIS — B3731 Acute candidiasis of vulva and vagina: Secondary | ICD-10-CM

## 2020-05-24 DIAGNOSIS — E782 Mixed hyperlipidemia: Secondary | ICD-10-CM

## 2020-05-24 DIAGNOSIS — R300 Dysuria: Secondary | ICD-10-CM

## 2020-05-24 MED ORDER — FLUCONAZOLE 150 MG PO TABS: ORAL_TABLET | ORAL | 1 refills | Status: DC

## 2020-05-24 NOTE — Patient Instructions (Signed)
   Increase water intake 64-80 ounces of water a day.   You may use AZO, you can get this from any pharmacy.  This will help with painful urination.  Take one tablet up to three times a day.  It will turn your urine BRIGHT ORANGE, this is normal.  We sent in Diflucan tablets for you, take one today and second tablet three days later.  We will contact you with lab results, likely will take 2-3 days for culture.  Please contact the office with any new or worsening symptoms.

## 2020-05-24 NOTE — Progress Notes (Signed)
FOLLOW UP 3 MONTH  Assessment and Plan:   Dysuria: Will check lab results Will treat bases on lab reulst  Vaginal candiasis Diflucan tablet, one now, then second tablet on day 3 is continued sytmptons,  Rash/polyartharlgia Had negative RF, antismith, and ESR Had low titer positive ANA at 1:80.  Had recent flare and states prednisone resolved 100% and she felt great. When patient gets insurance may refer to rheumatologist for evaluation and further labs- declines at this time.   Recurrent major depressive disorder, in partial remission ([facility]) -  stress management techniques discussed, increase water, good sleep hygiene discussed, increase exercise, and increase veggies.  - rare use of xanax, using melatonin at night  Tobacco use disorder -  instruction/counseling given, counseled patient on the dangers of tobacco use, advised patient to stop smoking, and reviewed strategies to maximize success, patient not ready to quit at this time.   Gastroesophageal reflux disease without esophagitis Continue PPI/H2 blocker, diet discussed  Uncomplicated asthma, unspecified asthma severity, unspecified whether persistent Avoid triggers, controlled at this time  Attention deficit disorder, unspecified hyperactivity presence -  Continue ADD medication, helps with focus, no AE's. The patient was counseled on the addictive nature of the medication and was encouraged to take drug holidays when not needed.   Hyperlipidemia, unspecified hyperlipidemia type - Mild elevations managed by lifestyle  -check lipids next OV, decrease fatty foods, increase activity.   Vitamin D deficiency Near goal at recent check; continue to recommend supplementation for goal of 60-100 Defer vitamin D level due to no insurance   Continue diet and meds as discussed. Further disposition pending results of labs. Discussed med's effects and SE's.   Over 30 minutes of face to face intervierw exam, counseling, chart  review, and critical decision making was performed.   Future Appointments  Date Time Provider Department Center  05/24/2020 11:30 AM Ismael Treptow, Bella Kennedy, NP [facility] None    ----------------------------------------------------------------------------------------------------------------------  HPI 49 y.o. female  presents for 63 month follow up on HLD, anxiety, ADD, smoking and vitamin D deficiency.   Reports she is having increased vaginal itching and dysuria that started five days ago. Patient does not have insurance. She was seen 05/21 for polyarthralgia, rash, nail changes, mouth sores- she was checked sed rate, AntiDNA, RF which were negative. She had a positive ANA but with low titer. She was given prednisone on 07/12 for a "flare of her symptoms" and states she felt the best that she has with a resolution of her symptoms. She would like to see a rheumatologist when she gets insurance.   No insurance currently; hoping to have insurance by next visit in 6 months with possible new job.    She has had stress fighting with her ex.  she has a diagnosis of anxiety and is currently on PRN xanax, reports symptoms are well controlled on current regimen. she currently uses rarely, typically when in court, etc. Averages 0-2 tabs. Last filled 03/29/2020 per PDMP review  Patient is on an ADD medication, she states that the medication is helping and she denies any adverse reactions. She currently takes 1-2 tabs on days that she works, Mon-Fri or on sick days.   she currently vapes daily; discussed risks associated with smoking, patient is not ready to quit. She has cut down on mg of nicotine and trying to stop due to mouth sores.   She has GERD well controlled recently, takes omperazole PRN, only needing once a week or so.   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   Wt Readings  from Last 3 Encounters:  02/09/20 148 lb (67.1 kg)  12/05/19 144 lb 6.4 oz (65.5 kg)  08/11/19 138 lb 9.6 oz (62.9 kg)   Today their BP is    She does workout. She denies chest pain, shortness of breath, dizziness.   She is not on cholesterol medication. Her cholesterol is not at goal. The cholesterol last visit was:   Lab Results  Component Value Date   CHOL 247 (H) 03/22/2018   HDL 115 03/22/2018   LDLCALC 118 (H) 03/22/2018   TRIG 50 03/22/2018   CHOLHDL 2.1 03/22/2018   Patient is on Vitamin D supplement, 10000 IU daily   Lab Results  Component Value Date   VD25OH 53 03/22/2018        Current Medications:   Current Outpatient Medications (Endocrine & Metabolic):  .  predniSONE (DELTASONE) 20 MG tablet, 2 tablets daily for 3 days, 1 tablet daily for 4 days.   Current Outpatient Medications (Respiratory):  Marland Kitchen  Loratadine-Pseudoephedrine (CLARITIN-D 24 HOUR PO), Take by mouth daily.    Current Outpatient Medications (Other):  .  acyclovir (ZOVIRAX) 400 MG tablet, Take 1 pill 3 times daily at start on cold sore (Patient taking differently: as needed. Take 1 pill 3 times daily at start on cold sore) .  ALPRAZolam (XANAX) 0.5 MG tablet, Take 1 tablet (0.5 mg total) by mouth 2 (two) times daily as needed. Limit to <5 days per week to avoid addiction/tolerance. Marland Kitchen  amphetamine-dextroamphetamine (ADDERALL) 20 MG tablet, Take 1/2-1 tablet 1-2 x /day ONLY as needed for ADD  & please try to limit to 5 days /week to avoid addiction. Should last much longer than 30 days. Marland Kitchen  azithromycin (ZITHROMAX) 250 MG tablet, Take 2 tablets (500 mg) on  Day 1,  followed by 1 tablet (250 mg) once daily on Days 2 through 5. .  CHOLECALCIFEROL PO, Take 10,000 Units by mouth daily. Marland Kitchen  ketoconazole (NIZORAL) 2 % cream, Apply 1 application topically 2 (two) times daily. Marland Kitchen  OMEPRAZOLE PO, Take by mouth as needed.  .  triamcinolone cream (KENALOG) 0.5 %, Apply 1 application topically 2 (two) times  daily.   Allergies:  Allergies  Allergen Reactions  . Codeine Nausea And Vomiting  . Penicillins Nausea And Vomiting     Medical History:  Past Medical History:  Diagnosis Date  . ADD (attention deficit disorder)   . Anemia   . Asthma   . Depression   . GERD (gastroesophageal reflux disease)    Family history- Reviewed and unchanged Social history- Reviewed and unchanged   Review of Systems:  Review of Systems  Constitutional: Negative for malaise/fatigue and weight loss.  HENT: Negative for hearing loss and tinnitus.   Eyes: Negative for blurred vision and double vision.  Respiratory: Negative for cough, shortness of breath and wheezing.   Cardiovascular: Negative for chest pain, palpitations, orthopnea, claudication and leg swelling.  Gastrointestinal: Negative for abdominal pain, blood in stool, constipation, diarrhea, heartburn, melena, nausea and vomiting.  Genitourinary: Negative.   Musculoskeletal: Negative for joint pain and myalgias.  Skin: Negative for rash.  Neurological: Negative for dizziness, tingling, sensory change, weakness and headaches.  Endo/Heme/Allergies: Negative for polydipsia.  Psychiatric/Behavioral: Negative.   All other systems reviewed and are negative.   Physical Exam: There were no  vitals taken for this visit. Wt Readings from Last 3 Encounters:  02/09/20 148 lb (67.1 kg)  12/05/19 144 lb 6.4 oz (65.5 kg)  08/11/19 138 lb 9.6 oz (62.9 kg)   General Appearance: Well nourished, in no apparent distress. Eyes: PERRLA, EOMs, conjunctiva no swelling or erythema Sinuses: No Frontal/maxillary tenderness ENT/Mouth: Ext aud canals clear, TMs without erythema, bulging. No erythema, swelling, or exudate on post pharynx.  Tonsils not swollen or erythematous. Hearing normal.  Neck: Supple, thyroid normal.  Respiratory: Respiratory effort normal, BS equal bilaterally without rales, rhonchi, wheezing or stridor.  Cardio: RRR with no MRGs. Brisk  peripheral pulses without edema.  Abdomen: Soft, + BS.  Non tender, no guarding, rebound, hernias, masses. Lymphatics: Non tender without lymphadenopathy.  Musculoskeletal: Full ROM, 5/5 strength, Normal gait Skin: Warm, dry without rashes, lesions, ecchymosis.  Neuro: Cranial nerves intact. No cerebellar symptoms.  Psych: Awake and oriented X 3, normal affect, Insight and Judgment appropriate.    Garnet Sierras, NP 11:29 AM [facility]

## 2020-05-25 ENCOUNTER — Other Ambulatory Visit: Payer: Self-pay

## 2020-05-25 DIAGNOSIS — F411 Generalized anxiety disorder: Secondary | ICD-10-CM

## 2020-05-25 LAB — URINE CULTURE
MICRO NUMBER:: 11174681
SPECIMEN QUALITY:: ADEQUATE

## 2020-05-25 MED ORDER — ALPRAZOLAM 0.5 MG PO TABS: 0.5000 mg | ORAL_TABLET | Freq: Two times a day (BID) | ORAL | 0 refills | Status: DC | PRN

## 2020-05-25 MED ORDER — AMPHETAMINE-DEXTROAMPHETAMINE 20 MG PO TABS: ORAL_TABLET | ORAL | 0 refills | Status: DC

## 2020-05-28 ENCOUNTER — Other Ambulatory Visit: Payer: Self-pay | Admitting: Adult Health Nurse Practitioner

## 2020-05-28 DIAGNOSIS — N3000 Acute cystitis without hematuria: Secondary | ICD-10-CM

## 2020-05-28 MED ORDER — SULFAMETHOXAZOLE-TRIMETHOPRIM 800-160 MG PO TABS: 1.0000 | ORAL_TABLET | Freq: Two times a day (BID) | ORAL | 0 refills | Status: DC

## 2020-06-27 ENCOUNTER — Other Ambulatory Visit: Payer: Self-pay

## 2020-06-30 ENCOUNTER — Other Ambulatory Visit: Payer: Self-pay | Admitting: Adult Health

## 2020-06-30 DIAGNOSIS — F411 Generalized anxiety disorder: Secondary | ICD-10-CM

## 2020-06-30 MED ORDER — AMPHETAMINE-DEXTROAMPHETAMINE 20 MG PO TABS: ORAL_TABLET | ORAL | 0 refills | Status: DC

## 2020-06-30 MED ORDER — ALPRAZOLAM 0.5 MG PO TABS: 0.5000 mg | ORAL_TABLET | Freq: Two times a day (BID) | ORAL | 0 refills | Status: DC | PRN

## 2020-06-30 NOTE — Progress Notes (Signed)
Future Appointments  Date Time Provider Department Center  12/22/2020  9:00 AM Judd Gaudier, NP [facility] None   PDMP reviewed for adderall and xanax refill.

## 2020-07-28 DIAGNOSIS — R21 Rash and other nonspecific skin eruption: Secondary | ICD-10-CM

## 2020-07-29 MED ORDER — DEXAMETHASONE 0.5 MG PO TABS: ORAL_TABLET | ORAL | 0 refills | Status: DC

## 2020-07-29 NOTE — Telephone Encounter (Signed)
Virtual Visit via Telephone Note  I connected with Vanessa Lee on 07/29/2020 at 1345 by telephone and verified that I am speaking with the correct person using two identifiers.  Location: Patient: home Provider: [facility] office    I discussed the limitations, risks, security and privacy concerns of performing an evaluation and management service by telephone and the availability of in person appointments. I also discussed with the patient that there may be a patient responsible charge related to this service. The patient expressed understanding and agreed to proceed.   History of Present Illness:  50 y.o. female contacted office requesting virtual evaluation for rash of chest due to current quarantine status. She describes blistered rash to chest, persistent for 2 weeks, hurts for shirt to touch, irritated. She was worried about r/o shingles.    She reports has been feeling tired and run down; started getting red all across her chest and started breaking out with bumps. She reports though she was reacting to her shirt, but hadn't changed detergents. Does note new perfume.   Denies fever, endorses chills. She does have joint pain, but consistent with baseline, note + ANA, 12/05/2019, 1:80, declined further workup or referral at that time, but now interested in possibly persuing rheumatology evaluation. She will schedule in person follow up once out of quarantine -  Allergies:  Allergies  Allergen Reactions  . Codeine Nausea And Vomiting  . Penicillins Nausea And Vomiting   Medical History:  has Depression; GERD (gastroesophageal reflux disease); ADD (attention deficit disorder); Asthma; Tobacco use disorder; and Hyperlipidemia on their problem list. Family History:  Herfamily history includes Brain cancer in her maternal grandmother; Breast cancer in her maternal grandmother; Cancer in her mother; Diabetes in her father; Heart attack (age of onset: 67) in her paternal grandfather;  Hypertension in her father; Lung cancer in her maternal grandfather; Lung cancer (age of onset: 45) in her maternal uncle. Social History:   reports that she has been smoking e-cigarettes. She has been smoking about 1.00 pack per day. She has never used smokeless tobacco. She reports current alcohol use. She reports that she does not use drugs.  Observations/Objective:  General : Well sounding patient in no apparent distress HEENT: no hoarseness, no cough for duration of visit Lungs: speaks in complete sentences, no audible wheezing, no apparent distress Neurological: alert, oriented x 3 Psychiatric: pleasant, judgement appropriate            Assessment and Plan:  Diagnoses and all orders for this visit:  Rash and nonspecific skin eruption Unclear etiology; photos fairly difficult to evaluate though discussed with total chest sx initially unlikely shingles; possible contact dermatitis/allergic reaction She has triamcinolone 0.5 mg cream at home but hasn't tried - try BID STOP any new products/detergents; can slowly reintroduce to identify if recurrent Try oral steroid if topical is helping but not resolving; STOP and call if this makes worse, will evaluate in office  -     dexamethasone (DECADRON) 0.5 MG tablet; Take 1 tablet PO BID for 5 days and then 1 tablet PO QDaily for 5 days.  Reports currently uninsured - getting new in Feb 2022; waive charge today, encouraged to schedule follow up with new insurance to discuss + ANA and rheumatology referral  Follow Up Instructions:    I discussed the assessment and treatment plan with the patient. The patient was provided an opportunity to ask questions and all were answered. The patient agreed with the plan and demonstrated an understanding of the  instructions.   The patient was advised to call back or seek an in-person evaluation if the symptoms worsen or if the condition fails to improve as anticipated.  I provided 15 minutes of  non-face-to-face time during this encounter.   Dan Maker, NP

## 2020-08-04 ENCOUNTER — Other Ambulatory Visit: Payer: Self-pay

## 2020-08-04 MED ORDER — AMPHETAMINE-DEXTROAMPHETAMINE 20 MG PO TABS: ORAL_TABLET | ORAL | 0 refills | Status: DC

## 2020-08-05 ENCOUNTER — Other Ambulatory Visit: Payer: Self-pay | Admitting: Adult Health

## 2020-08-05 MED ORDER — AMPHETAMINE-DEXTROAMPHETAMINE 20 MG PO TABS: ORAL_TABLET | ORAL | 0 refills | Status: DC

## 2020-08-09 ENCOUNTER — Other Ambulatory Visit: Payer: Self-pay | Admitting: Adult Health

## 2020-08-09 MED ORDER — ACYCLOVIR 400 MG PO TABS: ORAL_TABLET | ORAL | 1 refills | Status: DC

## 2020-08-30 ENCOUNTER — Encounter: Payer: Self-pay | Admitting: Adult Health

## 2020-08-30 ENCOUNTER — Other Ambulatory Visit: Payer: Self-pay

## 2020-08-30 ENCOUNTER — Ambulatory Visit (INDEPENDENT_AMBULATORY_CARE_PROVIDER_SITE_OTHER): Payer: Self-pay | Admitting: Adult Health

## 2020-08-30 VITALS — BP 132/80 | HR 85 | Temp 96.6°F | Wt 161.0 lb

## 2020-08-30 DIAGNOSIS — Z1152 Encounter for screening for COVID-19: Secondary | ICD-10-CM | POA: Diagnosis not present

## 2020-08-30 DIAGNOSIS — J0100 Acute maxillary sinusitis, unspecified: Secondary | ICD-10-CM

## 2020-08-30 LAB — POC COVID19 BINAXNOW: SARS Coronavirus 2 Ag: NEGATIVE

## 2020-08-30 MED ORDER — PREDNISONE 20 MG PO TABS: ORAL_TABLET | ORAL | 0 refills | Status: DC

## 2020-08-30 MED ORDER — AZITHROMYCIN 250 MG PO TABS: ORAL_TABLET | ORAL | 1 refills | Status: AC

## 2020-08-30 NOTE — Progress Notes (Signed)
Assessment and Plan:  Vanessa Lee was seen today for acute visit.  Diagnoses and all orders for this visit:  Acute non-recurrent maxillary sinusitis Rapid covid 19 negative Persistent progressive sx suggestive of sinusitis Penicillin alergy, reports nausea with doxycycline last time and would prefer to avoid Has done well with zpak in the past Suggested symptomatic OTC remedies. Nasal saline spray for congestion. Restart allergy pill, oral steroids offered Follow up as needed. -     azithromycin (ZITHROMAX) 250 MG tablet; Take 2 tablets (500 mg) on  Day 1,  followed by 1 tablet (250 mg) once daily on Days 2 through 5. -     predniSONE (DELTASONE) 20 MG tablet; 2 tablets daily for 3 days, 1 tablet daily for 4 days.  Encounter for screening for COVID-19 -     POC COVID-19  Further disposition pending results of labs. Discussed med's effects and SE's.   Over 15 minutes of exam, counseling, chart review, and critical decision making was performed.   Future Appointments  Date Time Provider Department Center  12/22/2020  9:00 AM Judd Gaudier, NP [facility] None    ------------------------------------------------------------------------------------------------------------------   HPI BP 132/80   Pulse 85   Temp (!) 96.6 F (35.9 C)   Wt 161 lb (73 kg)   SpO2 98%   BMI 27.21 kg/m   50 y.o.female current vaper with seasonal allergies presents for evaluation of 1 week of URI and sinus sx. She had covid exposures at work, did have negative rapid covid 19 screen today prior to being seen.   She reports sx started 1 week ago with HA, sore throat, nasal congestion, generalized sinus pressure, also irritation and scabs in her nose. Progressive sx. Denies fever, does endorse chills.   She has been taking tylenol sinus/cold (benefit with headache), has been doing saline nasal irrigations but not improving. Feels sx are persistent and getting worse.   Does have year round allergies, was  taking zyrtec, claritin D, didn't feel this was helping and stopped taking several months back.   Past Medical History:  Diagnosis Date  . ADD (attention deficit disorder)   . Anemia   . Asthma   . Depression   . GERD (gastroesophageal reflux disease)      Allergies  Allergen Reactions  . Codeine Nausea And Vomiting  . Penicillins Nausea And Vomiting    Current Outpatient Medications on File Prior to Visit  Medication Sig  . acyclovir (ZOVIRAX) 400 MG tablet Take 1 pill 3 times daily at start on cold sore  . ALPRAZolam (XANAX) 0.5 MG tablet Take 1 tablet (0.5 mg total) by mouth 2 (two) times daily as needed. Limit to <5 days per week to avoid addiction/tolerance.  Marland Kitchen amphetamine-dextroamphetamine (ADDERALL) 20 MG tablet Take 1/2-1 tablet 1-2 x /day ONLY as needed for ADD  & please try to limit to 5 days /week to avoid addiction. Should last much longer than 30 days.  . CHOLECALCIFEROL PO Take 10,000 Units by mouth daily.  . Loratadine-Pseudoephedrine (CLARITIN-D 24 HOUR PO) Take by mouth daily.  Marland Kitchen OMEPRAZOLE PO Take by mouth as needed.   . triamcinolone cream (KENALOG) 0.5 % Apply 1 application topically 2 (two) times daily.  Marland Kitchen sulfamethoxazole-trimethoprim (BACTRIM DS) 800-160 MG tablet Take 1 tablet by mouth 2 (two) times daily.   No current facility-administered medications on file prior to visit.    ROS: all negative except above.   Physical Exam:  BP 132/80   Pulse 85   Temp (!) 96.6  F (35.9 C)   Wt 161 lb (73 kg)   SpO2 98%   BMI 27.21 kg/m   General Appearance: Well nourished, in no apparent distress. Eyes: PERRLA, EOMs, conjunctiva no swelling or erythema Sinuses: No Frontal tenderness, does have bil maxillary tenderness ENT/Mouth: Ext aud canals clear, R TM absent, L without erythema, bulging. No erythema, swelling, or exudate on post pharynx.  Tonsils not swollen or erythematous. Has bil hearing aids.  Neck: Supple, thyroid normal.  Respiratory: Respiratory  effort normal, BS equal bilaterally without rales, rhonchi, wheezing or stridor.  Cardio: RRR with no MRGs. Brisk peripheral pulses without edema.  Abdomen: Soft, + BS.  Non tender, no guarding, rebound, hernias, masses. Lymphatics: Non tender without lymphadenopathy.  Musculoskeletal: Full ROM, 5/5 strength, normal gait.  Skin: Warm, dry without rashes, lesions, ecchymosis.  Neuro: Cranial nerves intact. Normal muscle tone, no cerebellar symptoms. Sensation intact.  Psych: Awake and oriented X 3, normal affect, Insight and Judgment appropriate.     Dan Maker, NP 12:24 PM [facility]

## 2020-08-30 NOTE — Patient Instructions (Signed)
Get back on daily allergy med 2-4 weeks      Sinusitis, Adult Sinusitis is inflammation of your sinuses. Sinuses are hollow spaces in the bones around your face. Your sinuses are located:  Around your eyes.  In the middle of your forehead.  Behind your nose.  In your cheekbones. Mucus normally drains out of your sinuses. When your nasal tissues become inflamed or swollen, mucus can become trapped or blocked. This allows bacteria, viruses, and fungi to grow, which leads to infection. Most infections of the sinuses are caused by a virus. Sinusitis can develop quickly. It can last for up to 4 weeks (acute) or for more than 12 weeks (chronic). Sinusitis often develops after a cold. What are the causes? This condition is caused by anything that creates swelling in the sinuses or stops mucus from draining. This includes:  Allergies.  Asthma.  Infection from bacteria or viruses.  Deformities or blockages in your nose or sinuses.  Abnormal growths in the nose (nasal polyps).  Pollutants, such as chemicals or irritants in the air.  Infection from fungi (rare). What increases the risk? You are more likely to develop this condition if you:  Have a weak body defense system (immune system).  Do a lot of swimming or diving.  Overuse nasal sprays.  Smoke. What are the signs or symptoms? The main symptoms of this condition are pain and a feeling of pressure around the affected sinuses. Other symptoms include:  Stuffy nose or congestion.  Thick drainage from your nose.  Swelling and warmth over the affected sinuses.  Headache.  Upper toothache.  A cough that may get worse at night.  Extra mucus that collects in the throat or the back of the nose (postnasal drip).  Decreased sense of smell and taste.  Fatigue.  A fever.  Sore throat.  Bad breath. How is this diagnosed? This condition is diagnosed based on:  Your symptoms.  Your medical history.  A physical  exam.  Tests to find out if your condition is acute or chronic. This may include: ? Checking your nose for nasal polyps. ? Viewing your sinuses using a device that has a light (endoscope). ? Testing for allergies or bacteria. ? Imaging tests, such as an MRI or CT scan. In rare cases, a bone biopsy may be done to rule out more serious types of fungal sinus disease. How is this treated? Treatment for sinusitis depends on the cause and whether your condition is chronic or acute.  If caused by a virus, your symptoms should go away on their own within 10 days. You may be given medicines to relieve symptoms. They include: ? Medicines that shrink swollen nasal passages (topical intranasal decongestants). ? Medicines that treat allergies (antihistamines). ? A spray that eases inflammation of the nostrils (topical intranasal corticosteroids). ? Rinses that help get rid of thick mucus in your nose (nasal saline washes).  If caused by bacteria, your health care provider may recommend waiting to see if your symptoms improve. Most bacterial infections will get better without antibiotic medicine. You may be given antibiotics if you have: ? A severe infection. ? A weak immune system.  If caused by narrow nasal passages or nasal polyps, you may need to have surgery. Follow these instructions at home: Medicines  Take, use, or apply over-the-counter and prescription medicines only as told by your health care provider. These may include nasal sprays.  If you were prescribed an antibiotic medicine, take it as told by your  health care provider. Do not stop taking the antibiotic even if you start to feel better. Hydrate and humidify  Drink enough fluid to keep your urine pale yellow. Staying hydrated will help to thin your mucus.  Use a cool mist humidifier to keep the humidity level in your home above 50%.  Inhale steam for 10-15 minutes, 3-4 times a day, or as told by your health care provider. You can  do this in the bathroom while a hot shower is running.  Limit your exposure to cool or dry air.   Rest  Rest as much as possible.  Sleep with your head raised (elevated).  Make sure you get enough sleep each night. General instructions  Apply a warm, moist washcloth to your face 3-4 times a day or as told by your health care provider. This will help with discomfort.  Wash your hands often with soap and water to reduce your exposure to germs. If soap and water are not available, use hand sanitizer.  Do not smoke. Avoid being around people who are smoking (secondhand smoke).  Keep all follow-up visits as told by your health care provider. This is important.   Contact a health care provider if:  You have a fever.  Your symptoms get worse.  Your symptoms do not improve within 10 days. Get help right away if:  You have a severe headache.  You have persistent vomiting.  You have severe pain or swelling around your face or eyes.  You have vision problems.  You develop confusion.  Your neck is stiff.  You have trouble breathing. Summary  Sinusitis is soreness and inflammation of your sinuses. Sinuses are hollow spaces in the bones around your face.  This condition is caused by nasal tissues that become inflamed or swollen. The swelling traps or blocks the flow of mucus. This allows bacteria, viruses, and fungi to grow, which leads to infection.  If you were prescribed an antibiotic medicine, take it as told by your health care provider. Do not stop taking the antibiotic even if you start to feel better.  Keep all follow-up visits as told by your health care provider. This is important. This information is not intended to replace advice given to you by your health care provider. Make sure you discuss any questions you have with your health care provider. Document Revised: 12/03/2017 Document Reviewed: 12/03/2017 Elsevier Patient Education  2021 [employer].

## 2020-09-05 ENCOUNTER — Other Ambulatory Visit: Payer: Self-pay

## 2020-09-05 DIAGNOSIS — F411 Generalized anxiety disorder: Secondary | ICD-10-CM

## 2020-09-06 MED ORDER — AMPHETAMINE-DEXTROAMPHETAMINE 20 MG PO TABS: ORAL_TABLET | ORAL | 0 refills | Status: DC

## 2020-09-06 MED ORDER — ALPRAZOLAM 0.5 MG PO TABS: 0.5000 mg | ORAL_TABLET | Freq: Two times a day (BID) | ORAL | 0 refills | Status: DC | PRN

## 2020-10-10 ENCOUNTER — Other Ambulatory Visit: Payer: Self-pay

## 2020-10-10 ENCOUNTER — Other Ambulatory Visit: Payer: Self-pay | Admitting: Adult Health Nurse Practitioner

## 2020-10-10 MED ORDER — AMPHETAMINE-DEXTROAMPHETAMINE 20 MG PO TABS: ORAL_TABLET | ORAL | 0 refills | Status: DC

## 2020-11-03 ENCOUNTER — Other Ambulatory Visit: Payer: Self-pay

## 2020-11-03 DIAGNOSIS — F411 Generalized anxiety disorder: Secondary | ICD-10-CM

## 2020-11-03 MED ORDER — ALPRAZOLAM 0.5 MG PO TABS: 0.5000 mg | ORAL_TABLET | Freq: Two times a day (BID) | ORAL | 0 refills | Status: DC | PRN

## 2020-11-11 ENCOUNTER — Other Ambulatory Visit: Payer: Self-pay

## 2020-11-11 MED ORDER — AMPHETAMINE-DEXTROAMPHETAMINE 20 MG PO TABS: ORAL_TABLET | ORAL | 0 refills | Status: DC

## 2020-11-17 ENCOUNTER — Other Ambulatory Visit: Payer: Self-pay | Admitting: Adult Health

## 2020-11-17 MED ORDER — AMPHETAMINE-DEXTROAMPHETAMINE 20 MG PO TABS: ORAL_TABLET | ORAL | 0 refills | Status: DC

## 2020-11-17 NOTE — Progress Notes (Signed)
Future Appointments  Date Time Provider Department Center  12/22/2020  9:00 AM Judd Gaudier, NP [facility] None    PDMP reviewed for adderall refill request.

## 2020-11-23 DIAGNOSIS — H9193 Unspecified hearing loss, bilateral: Secondary | ICD-10-CM | POA: Diagnosis not present

## 2020-11-23 DIAGNOSIS — J0121 Acute recurrent ethmoidal sinusitis: Secondary | ICD-10-CM | POA: Diagnosis not present

## 2020-11-23 DIAGNOSIS — F17290 Nicotine dependence, other tobacco product, uncomplicated: Secondary | ICD-10-CM | POA: Diagnosis not present

## 2020-11-23 DIAGNOSIS — J301 Allergic rhinitis due to pollen: Secondary | ICD-10-CM | POA: Diagnosis not present

## 2020-12-13 DIAGNOSIS — W57XXXA Bitten or stung by nonvenomous insect and other nonvenomous arthropods, initial encounter: Secondary | ICD-10-CM | POA: Diagnosis not present

## 2020-12-13 DIAGNOSIS — S1096XA Insect bite of unspecified part of neck, initial encounter: Secondary | ICD-10-CM | POA: Diagnosis not present

## 2020-12-14 ENCOUNTER — Other Ambulatory Visit: Payer: Self-pay

## 2020-12-14 DIAGNOSIS — F411 Generalized anxiety disorder: Secondary | ICD-10-CM

## 2020-12-14 MED ORDER — ALPRAZOLAM 0.5 MG PO TABS: 0.5000 mg | ORAL_TABLET | Freq: Two times a day (BID) | ORAL | 0 refills | Status: DC | PRN

## 2020-12-16 ENCOUNTER — Encounter: Payer: Self-pay | Admitting: Internal Medicine

## 2020-12-16 ENCOUNTER — Other Ambulatory Visit: Payer: Self-pay

## 2020-12-16 ENCOUNTER — Ambulatory Visit: Payer: BC Managed Care – PPO | Admitting: Internal Medicine

## 2020-12-16 VITALS — BP 138/90 | HR 93 | Temp 97.7°F | Resp 18 | Ht 64.0 in | Wt 164.8 lb

## 2020-12-16 DIAGNOSIS — S1096XS Insect bite of unspecified part of neck, sequela: Secondary | ICD-10-CM | POA: Diagnosis not present

## 2020-12-16 DIAGNOSIS — W57XXXD Bitten or stung by nonvenomous insect and other nonvenomous arthropods, subsequent encounter: Secondary | ICD-10-CM

## 2020-12-16 DIAGNOSIS — S1096XD Insect bite of unspecified part of neck, subsequent encounter: Secondary | ICD-10-CM

## 2020-12-16 DIAGNOSIS — I10 Essential (primary) hypertension: Secondary | ICD-10-CM

## 2020-12-16 LAB — CBC WITH DIFFERENTIAL/PLATELET
Absolute Monocytes: 614 cells/uL (ref 200–950)
Basophils Absolute: 67 cells/uL (ref 0–200)
Basophils Relative: 0.9 %
Eosinophils Absolute: 59 cells/uL (ref 15–500)
Eosinophils Relative: 0.8 %
HCT: 42.7 % (ref 35.0–45.0)
Hemoglobin: 13.4 g/dL (ref 11.7–15.5)
Lymphs Abs: 1436 cells/uL (ref 850–3900)
MCH: 28.7 pg (ref 27.0–33.0)
MCHC: 31.4 g/dL — ABNORMAL LOW (ref 32.0–36.0)
MCV: 91.4 fL (ref 80.0–100.0)
MPV: 9.5 fL (ref 7.5–12.5)
Monocytes Relative: 8.3 %
Neutro Abs: 5224 cells/uL (ref 1500–7800)
Neutrophils Relative %: 70.6 %
Platelets: 381 10*3/uL (ref 140–400)
RBC: 4.67 10*6/uL (ref 3.80–5.10)
RDW: 14.1 % (ref 11.0–15.0)
Total Lymphocyte: 19.4 %
WBC: 7.4 10*3/uL (ref 3.8–10.8)

## 2020-12-16 NOTE — Progress Notes (Signed)
     Future Appointments  Date Time Provider Department Center  12/16/2020  4:00 PM Lucky Cowboy, MD [facility] None  12/22/2020  9:00 AM Judd Gaudier, NP [facility] None    History of Present Illness:      Patient is a very nice 50 yo MWF who went to an urgent care 4 days ago to have a tick removed. She has concerns of having Tic Fever, altho she denies any fever, rash , HA's.    Medications  .  CLARITIN-D 24 HR, Take by mouth daily. Marland Kitchen  ALPRAZolam0.5 MG tablet, Take 1 tablet 2 times daily as needed.  .  ADDERAL 20 MG tablet, Take 1/2-1 tablet 1-2 x /day ONLY as needed for ADD, .  CHOLECALCIFEROL PO, Take 10,000 Units by mouth daily. Marland Kitchen  OMEPRAZOLE PO, Take by mouth as needed.  .  triamcinolone cream (KENALOG) 0.5 %, Apply 1 application topically 2 (two) times daily. Marland Kitchen  acyclovir (ZOVIRAX) (Patient not taking: Reported on 12/16/2020) .  sulfamethoxazole-trimethoprim (BACTRIM DS) 800-160 MG tablet, Take 1 tablet by mouth 2 (two) times daily. (Patient not taking: Reported on 12/16/2020)  Problem list She has Depression; GERD (gastroesophageal reflux disease); ADD (attention deficit disorder); Asthma; Tobacco use disorder; and Hyperlipidemia on their problem list.   Observations/Objective:  BP 138/90 (BP Location: Right Arm, Patient Position: Sitting, Cuff Size: Normal)   Pulse 93   Temp 97.7 F (36.5 C)   Resp 18   Ht 5\' 4"  (1.626 m)   Wt 164 lb 12.8 oz (74.8 kg)   LMP 12/06/2020   SpO2 99%   BMI 28.29 kg/m   HEENT - WNL. Neck - supple.  Chest - Clear equal BS. Cor - Nl HS. RRR w/o sig MGR. PP 1(+). No edema. MS- FROM w/o deformities.  Gait Nl. Neuro -  Nl w/o focal abnormalities. Skin  - has a small area of induration where tick was alleged removed and  w/o signs of cellulitis or rash.\   Assessment and Plan:   1. Tick bite of neck, subsequent encounter  - CBC with Differential/Platelet - [place] spotted fvr abs pnl(IgG+IgM) - B. burgdorfi  antibodies   Follow Up Instructions:        I discussed the assessment and treatment plan with the patient. The patient was provided an opportunity to ask questions and all were answered. The patient agreed with the plan and demonstrated an understanding of the instructions.        The patient was advised to call back or seek an in-person evaluation if the symptoms worsen or if the condition fails to improve as anticipated.    12/08/2020, MD

## 2020-12-16 NOTE — Patient Instructions (Signed)
Tick Bite Information  Ticks are insects that draw blood for food. Most ticks live in shrubs and grassy and wooded areas. They climb onto people and animals that brush against the leaves and grasses that they rest on. Then they bite, attaching themselves to the skin. Most ticks are harmless, but some ticks may carry germs that can spread to a person through a bite and cause a disease. To reduce your risk of getting a disease from a tick bite, make sure you:  Take steps to prevent tick bites.  Check for ticks after being outdoors where ticks live.  Watch for symptoms of disease if a tick attached to you or if you suspect a tick bite.   How can I prevent tick bites?  Take these steps to help prevent tick bites when you go outdoors in an area where ticks live:  Use insect repellent   Use insect repellent that has DEET (20% or higher), picaridin, or IR3535 in it. Follow the instructions on the label. Use these products on: ? Bare skin. ? The top of your boots. ? Your pant legs. ? Your sleeve cuffs.  For insect repellent that contains permethrin, follow the instructions on the label. Use these products on: ? Clothing. ? Boots. ? Outdoor gear. ? Tents. ?   When you are outside  Wear protective clothing. Long sleeves and long pants offer the best protection from ticks.  Wear light-colored clothing so you can see ticks more easily.  Tuck your pant legs into your socks.  If you go walking on a trail, stay in the middle of the trail so your skin, hair, and clothing do not touch the bushes.  Avoid walking through areas with long grass.  Check for ticks on your clothing, hair, and skin often while you are outside, and check again before you go inside. Make sure to check the scalp, neck, armpits, waist, groin, and joint areas. These are the spots where ticks attach themselves most often.   When you go indoors   Check your clothing for ticks. Tumble dry clothes in a dryer on high  heat for at least 10 minutes. If clothes are damp, additional time may be needed. If clothes require washing, use hot water.  Examine gear and pets.  Shower soon after being outdoors.  Check your body for ticks. Conduct a full body check using a mirror. What is the proper way to remove a tick? If you find a tick on your body, remove it as soon as possible. Removing a tick sooner can prevent germs from passing to your body. Do not remove the tick with your bare fingers. To remove a tick that is crawling on your skin but has not bitten, use either of these methods:  Go outdoors and brush the tick off.  Remove the tick with tape or a lint roller.   To remove a tick that is attached to your skin:  1. Wash your hands. If you have latex gloves, put them on. 2. Use fine-tipped tweezers, curved forceps, or a tick-removal tool to gently grasp the tick as close to your skin and the tick's head as possible. 3. Gently pull with a steady, upward, even pressure until the tick lets go. 4. When removing the tick: ? Take care to keep the tick's head attached to its body. ? Do not twist or jerk the tick. This can make the tick's head or mouth parts break off and remain in the skin. ? Do not  squeeze or crush the tick's body. This could force disease-carrying fluids from the tick into your body. ?  Do not try to remove a tick with heat, alcohol,  or fingernail polish. Using these methods can cause the tick to salivate and regurgitate into your bloodstream, increasing your risk of getting a disease.    What should I do after removing a tick?   Dispose of the tick. Do not crush a tick with your fingers.    Clean the bite area and your hands with soap and water, rubbing alcohol, or an iodine scrub.  If an antiseptic cream or ointment is available, apply a small amount to the bite site.  Wash and disinfect any instruments that you used to remove the tick  . How should I dispose of a tick? To  dispose of a live tick, use one of these methods:  Place it in rubbing alcohol.  Place it in a sealed bag or container.  Wrap it tightly in tape.  Flush it down the toilet.\    Contact a health care provider if:    You have symptoms of a disease after a tick bite. Symptoms of a tick-borne disease can occur from moments after the tick bites to 30 days after a tick is removed. Symptoms include: ? Fever or chills. ? Any of these signs in the bite area:  A red rash that makes a circle (bull's-eye rash) in the bite area.  Redness and swelling. ? Headache. ? Muscle, joint, or bone pain. ? Abnormal tiredness. ? Numbness in your legs or difficulty walking or moving your legs. ? Tender, swollen lymph glands.  A part of a tick breaks off and gets stuck in your skin.   Get help right away if:   You are not able to remove a tick.  You experience muscle weakness or paralysis.  Your symptoms get worse or you experience new symptoms.  You find an engorged tick on your skin and you are in an area where disease from ticks is a high risk.   Summary   Ticks may carry germs that can spread to a person through a bite and cause a disease.  Wear protective clothing and use insect repellent to prevent tick bites. Follow the instructions on the label.  If you find a tick on your body, remove it as soon as possible. If the tick is attached, do not try to remove with heat, alcohol, petroleum jelly, or fingernail polish.  Remove the attached tick using fine-tipped tweezers, curved forceps, or a tick-removal tool. Gently pull with steady, upward, even pressure until the tick lets go. Do not twist or jerk the tick. Do not squeeze or crush the tick's body.  If you have symptoms of a disease after being bitten by a tick, contact a health care provider.

## 2020-12-17 LAB — ROCKY MTN SPOTTED FVR ABS PNL(IGG+IGM)
RMSF IgG: NOT DETECTED
RMSF IgM: NOT DETECTED

## 2020-12-17 LAB — B. BURGDORFI ANTIBODIES: B burgdorferi Ab IgG+IgM: <0.9 index

## 2020-12-19 NOTE — Progress Notes (Signed)
============================================================ -   Test results slightly outside the reference range are not unusual. If there is anything important, I will review this with you,  otherwise it is considered normal test values.  If you have further questions,  please do not hesitate to contact me at the office or via My Chart.  ============================================================ ============================================================  -  CBC is Normal - Red cell count & WBC - both Normal ============================================================ ============================================================  -  Tests for RMSF & Lyme's   Disease - Both Negative & OK  ============================================================ ============================================================

## 2020-12-20 DIAGNOSIS — E663 Overweight: Secondary | ICD-10-CM | POA: Insufficient documentation

## 2020-12-20 DIAGNOSIS — E559 Vitamin D deficiency, unspecified: Secondary | ICD-10-CM | POA: Insufficient documentation

## 2020-12-20 DIAGNOSIS — E669 Obesity, unspecified: Secondary | ICD-10-CM | POA: Insufficient documentation

## 2020-12-20 NOTE — Progress Notes (Signed)
Complete Physical  Assessment and Plan:  Encounter for general adult medical examination without abnormal findings - reminded to schedule mammogram - order placed - discussed GYN - she plans to schedule this year  Recurrent major depressive disorder, in partial remission (HCC) -  stress management techniques discussed, increase water, good sleep hygiene discussed, increase exercise, and increase veggies.  -     TSH  Tobacco use disorder Smoking cessation-  instruction/counseling given, counseled patient on the dangers of tobacco use, advised patient to stop smoking, and reviewed strategies to maximize success, patient not ready to quit at this time but slowly tapering  Gastroesophageal reflux disease without esophagitis Continue PPI/H2 blocker, diet discussed  Uncomplicated asthma, unspecified asthma severity, unspecified whether persistent Avoid triggers, controlled at this time  Attention deficit disorder, unspecified hyperactivity presence -  Continue ADD medication, helps with focus, no AE's. The patient was counseled on the addictive nature of the medication and was encouraged to take drug holidays when not needed.  PDMP monitored -   Medication management -     Magnesium  Hyperlipidemia, unspecified hyperlipidemia type -continue medications, check lipids, decrease fatty foods, increase activity.  -     Lipid panel -     TSH  Vitamin D deficiency -     VITAMIN D 25 Hydroxy (Vit-D Deficiency, Fractures)  Positive ANA With episodic fatigue, polyarthralgia, rash episodes that dramatically improve with steroid Will refer to rheum to r/o Lupus now that she has insurance  Vapes nicotine containing substance Smoking cessation discussed; tapering; declines meds -     DG Chest 2 View; Future  Screening for colon cancer Colonoscopy- patient declines a colonoscopy even though the risks and benefits were discussed at length. Colon cancer is 3rd most diagnosed cancer and 2nd  leading cause of death in both men and women 23 years of age and older. Patient understands the risk of cancer and death with declining the test however they are willing to do cologuard screening instead. They understand that this is not as sensitive or specific as a colonoscopy and they are still recommended to get a colonoscopy. The cologuard will be sent out to their house.  -     Cologuard  Encounter for screening mammogram for malignant neoplasm of breast -     MM Digital Screening; Future  Elevated BP without diagnosis of hypertension Screening for cardiovascular condition Monitor blood pressure at home; call if consistently over 130/80 Continue DASH diet.   Reminder to go to the ER if any CP, SOB, nausea, dizziness, severe HA, changes vision/speech, left arm numbness and tingling and jaw pain. -     EKG 12-Lead    Orders Placed This Encounter  Procedures  . DG Chest 2 View  . MM Digital Screening  . CBC with Differential/Platelet  . COMPLETE METABOLIC PANEL WITH GFR  . Lipid panel  . TSH  . Hemoglobin A1c  . VITAMIN D 25 Hydroxy (Vit-D Deficiency, Fractures)  . Urinalysis, Routine w reflex microscopic  . Cologuard  . EKG 12-Lead    Discussed med's effects and SE's. Screening labs and tests as requested with regular follow-up as recommended. Over 40 minutes of exam, counseling, chart review, and complex, high level critical decision making was performed this visit.   Future Appointments  Date Time Provider Department Center  05/23/2021  9:30 AM Judd Gaudier, NP [facility] None  12/22/2021  9:00 AM Judd Gaudier, NP [facility] None     HPI  50 y.o. female  presents  for a complete physical and follow up for has Major depression in remission [facility](HCC); GERD (gastroesophageal reflux disease); ADD (attention deficit disorder); Asthma; Tobacco use disorder; Hyperlipidemia; Overweight (BMI 25.0-29.9); Vitamin D deficiency; Vapes nicotine containing substance; and Elevated BP  without diagnosis of hypertension on their problem list.    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 She is divorced, 2 kids. Lots of stress, gets taken to court every year. Overdue CPE as didn't have insurance until this year. She is dating new partner, does request STD testing. She is still having cycles but heavy and irregular. She plans to set up appointment with GYN.   She had tick bite to R shoulder, was given course of doxycycline by dermatology and currently completing this.   She was seen 12/05/19 for polyarthralgia, rash, nail changes, mouth sores- she was checked sed rate, AntiDNA, RF which were negative. She had a positive ANA but with low titer 1:80. She was given prednisone on 07/12 for a "flare of her symptoms" and states she felt the best that she has with a resolution of her symptoms. She would like to see a rheumatologist when she gets insurance.   She has had stress fighting with her ex, depression in remission. She has a diagnosis of anxiety and is currently on PRN xanax, reports symptoms are well controlled on current regimen. She currently uses rarely, typically when in court, etc.  Patient is on an ADD medication, she states that the medication is helping and she denies any adverse reactions. She currently takes 1-2 tabs on days that she works, Mon-Fri.   She was noting missing pills, caught neighbor breaking into home and taking pills on cameras in home, has been reported to police.   she currently vapes daily; discussed risks associated with smoking, patient is ready to quit. She has cut down on mg of nicotine and trying to stop, reducing dose every week.   She has GERD well controlled recently, takes omperazole PRN, only needing once a week or so.   BMI is Body mass index is 28.6 kg/m., she has been working on diet and exercise. Walks at least 10000 steps daily.  Wt Readings from Last 3 Encounters:  12/22/20 166 lb 9.6 oz (75.6 kg)   12/16/20 164 lb 12.8 oz (74.8 kg)  08/30/20 161 lb (73 kg)   She does not check BPs at home, today their BP is BP: 132/78 She does not workout. She denies chest pain, shortness of breath, dizziness. She does endorse intermittent palpitations.   she has a diagnosis of GERD which is currently managed by pantoprazole 40 mg every 2-3 days.  she reports symptoms is currently well controlled, and denies breakthrough reflux, burning in chest, hoarseness or cough.    She is not on cholesterol medication and denies myalgias. Her cholesterol is at goal. The cholesterol last visit was:   Lab Results  Component Value Date   CHOL 247 (H) 03/22/2018   HDL 115 03/22/2018   LDLCALC 118 (H) 03/22/2018   TRIG 50 03/22/2018   CHOLHDL 2.1 03/22/2018    Last A1C in the office was:  Lab Results  Component Value Date   HGBA1C 5.1 03/22/2018   Patient is currently on 5000 IU Vitamin D supplement.   Lab Results  Component Value Date   VD25OH 53 03/22/2018     Lab Results  Component Value Date   GFRNONAA 108 12/05/2019     Current Medications:  Current Outpatient Medications  on File Prior to Visit  Medication Sig Dispense Refill  . ALPRAZolam (XANAX) 0.5 MG tablet Take 1 tablet (0.5 mg total) by mouth 2 (two) times daily as needed. Limit to <5 days per week to avoid addiction/tolerance. 60 tablet 0  . amphetamine-dextroamphetamine (ADDERALL) 20 MG tablet Take 1/2-1 tablet 1-2 x /day ONLY as needed for ADD, avoid taking daily to avoid tolerance and addiction. Script should last longer than 30 days. 60 tablet 0  . CHOLECALCIFEROL PO Take 10,000 Units by mouth daily.    Marland Kitchen OMEPRAZOLE PO Take by mouth as needed.     . triamcinolone cream (KENALOG) 0.5 % Apply 1 application topically 2 (two) times daily. (Patient taking differently: Apply 1 application topically as needed.) 80 g 2  . Loratadine-Pseudoephedrine (CLARITIN-D 24 HOUR PO) Take by mouth daily. (Patient not taking: Reported on 12/22/2020)      No current facility-administered medications on file prior to visit.   Allergies:  Allergies  Allergen Reactions  . Codeine Nausea And Vomiting  . Penicillins Nausea And Vomiting   Medical History:  She has Major depression in remission [facility]); GERD (gastroesophageal reflux disease); ADD (attention deficit disorder); Asthma; Tobacco use disorder; Hyperlipidemia; Overweight (BMI 25.0-29.9); Vitamin D deficiency; Vapes nicotine containing substance; and Elevated BP without diagnosis of hypertension on their problem list. Health Maintenance:   Immunization History  Administered Date(s) Administered  . PFIZER(Purple Top)SARS-COV-2 Vaccination 02/22/2020, 03/13/2020  . Tdap 02/07/2017   Tetanus: 2018 Pneumovax: - discuss next visit, declines today Prevnar 13:  N/A Flu vaccine: N/A Shingrix: discucss age 63  Patient's last menstrual period was 12/06/2020. Pap: 03/2018 normal neg HPV, due 3-5 years, plans to schedule with GYN later this year MGM: 11/2015 , reminded to schedule - planning on getting established with GYN, but will order to schedule at breast center due to family history  DEXA: n/a  CXR: 2018  Colonoscopy: DUE , - cologuard EGD:  Vision: wears glasses, sees annually Dental: Dr. Marland Kitchen 2022, goes q78m  Patient Care Team: Lucky Cowboy, MD as PCP - General (Internal Medicine)  Surgical History:  She has a past surgical history that includes External ear surgery; Inner ear surgery; Middle ear surgery; Bladder surgery; Cesarean section; mastoid surgery; Adenoidectomy (Bilateral); and Breast cyst excision (1994). Family History:  Herfamily history includes Brain cancer in her maternal grandmother; Breast cancer in her maternal grandmother; Cancer in her mother; Diabetes in her father; Heart attack (age of onset: 19) in her paternal grandfather; Hypertension in her father; Lung cancer in her maternal grandfather; Lung cancer (age of onset: 61) in her maternal uncle. Social  History:  She reports that she has been smoking e-cigarettes. She has a 10.00 pack-year smoking history. She has never used smokeless tobacco. She reports current alcohol use. She reports that she does not use drugs.  Review of Systems: Review of Systems  Constitutional: Negative.  Negative for malaise/fatigue and weight loss.  HENT: Negative for hearing loss (chronic/unchanged) and tinnitus.   Eyes: Negative.  Negative for blurred vision and double vision.  Respiratory: Negative.  Negative for cough, sputum production, shortness of breath and wheezing.   Cardiovascular: Negative.  Negative for chest pain, palpitations, orthopnea, claudication, leg swelling and PND.  Gastrointestinal: Negative for abdominal pain, blood in stool, constipation, diarrhea, heartburn, melena, nausea and vomiting.  Genitourinary: Negative.   Musculoskeletal: Negative.  Negative for falls, joint pain and myalgias.  Skin: Positive for rash (intermittent).  Neurological: Negative.  Negative for dizziness, tingling, sensory change, weakness  and headaches.  Endo/Heme/Allergies: Negative for polydipsia.  Psychiatric/Behavioral: Negative.  Negative for depression, memory loss, substance abuse and suicidal ideas. The patient is not nervous/anxious and does not have insomnia.   All other systems reviewed and are negative.   Physical Exam: Estimated body mass index is 28.6 kg/m as calculated from the following:   Height as of this encounter: 5\' 4"  (1.626 m).   Weight as of this encounter: 166 lb 9.6 oz (75.6 kg). BP 132/78   Pulse 78   Temp 97.7 F (36.5 C)   Ht 5\' 4"  (1.626 m)   Wt 166 lb 9.6 oz (75.6 kg)   LMP 12/06/2020   SpO2 92%   BMI 28.60 kg/m  General Appearance: Well nourished, in no apparent distress.  Eyes: PERRLA, EOMs, conjunctiva no swelling or erythema Sinuses: No Frontal/maxillary tenderness  ENT/Mouth: Ext aud canals clear, normal light reflex with TMs without erythema, bulging. Good dentition.  No erythema, swelling, or exudate on post pharynx. Tonsils not swollen or erythematous. Poor hearing, bil hearing aids.  Neck: Supple, thyroid normal. No bruits  Respiratory: Respiratory effort normal, BS equal bilaterally without rales, rhonchi, wheezing or stridor.  Cardio: RRR without murmurs, rubs or gallops. Brisk peripheral pulses without edema.  Chest: symmetric, with normal excursions and percussion.  Breasts: Very dense and irregular throughout bilaterally, without nipple discharge, retractions.  Abdomen: Soft, nontender, no guarding, rebound, hernias, masses, or organomegaly.  Lymphatics: Non tender without lymphadenopathy.  Genitourinary: Defer - she plans to schedule GYN Musculoskeletal: Full ROM all peripheral extremities,5/5 strength, and normal gait.  Skin: Warm, dry without rashes, ecchymosis. She has scabbed lesion to R shoulder.  Neuro: Cranial nerves intact, reflexes equal bilaterally. Normal muscle tone, no cerebellar symptoms. Sensation intact.  Psych: Awake and oriented X 3, normal affect, Insight and Judgment appropriate.   EKG: NSR  Florine Sprenkle 12:45 PM [facility]

## 2020-12-22 ENCOUNTER — Ambulatory Visit: Payer: BC Managed Care – PPO | Admitting: Adult Health

## 2020-12-22 ENCOUNTER — Ambulatory Visit: Payer: Self-pay | Admitting: Adult Health

## 2020-12-22 ENCOUNTER — Other Ambulatory Visit: Payer: Self-pay

## 2020-12-22 ENCOUNTER — Encounter: Payer: Self-pay | Admitting: Adult Health

## 2020-12-22 VITALS — BP 132/78 | HR 78 | Temp 97.7°F | Ht 64.0 in | Wt 166.6 lb

## 2020-12-22 DIAGNOSIS — E782 Mixed hyperlipidemia: Secondary | ICD-10-CM

## 2020-12-22 DIAGNOSIS — F325 Major depressive disorder, single episode, in full remission: Secondary | ICD-10-CM

## 2020-12-22 DIAGNOSIS — F988 Other specified behavioral and emotional disorders with onset usually occurring in childhood and adolescence: Secondary | ICD-10-CM

## 2020-12-22 DIAGNOSIS — M2550 Pain in unspecified joint: Secondary | ICD-10-CM

## 2020-12-22 DIAGNOSIS — I10 Essential (primary) hypertension: Secondary | ICD-10-CM | POA: Insufficient documentation

## 2020-12-22 DIAGNOSIS — F3341 Major depressive disorder, recurrent, in partial remission: Secondary | ICD-10-CM

## 2020-12-22 DIAGNOSIS — Z136 Encounter for screening for cardiovascular disorders: Secondary | ICD-10-CM

## 2020-12-22 DIAGNOSIS — Z1329 Encounter for screening for other suspected endocrine disorder: Secondary | ICD-10-CM | POA: Diagnosis not present

## 2020-12-22 DIAGNOSIS — Z131 Encounter for screening for diabetes mellitus: Secondary | ICD-10-CM | POA: Diagnosis not present

## 2020-12-22 DIAGNOSIS — K219 Gastro-esophageal reflux disease without esophagitis: Secondary | ICD-10-CM

## 2020-12-22 DIAGNOSIS — Z13220 Encounter for screening for lipoid disorders: Secondary | ICD-10-CM | POA: Diagnosis not present

## 2020-12-22 DIAGNOSIS — Z0000 Encounter for general adult medical examination without abnormal findings: Secondary | ICD-10-CM | POA: Diagnosis not present

## 2020-12-22 DIAGNOSIS — F17200 Nicotine dependence, unspecified, uncomplicated: Secondary | ICD-10-CM

## 2020-12-22 DIAGNOSIS — Z1389 Encounter for screening for other disorder: Secondary | ICD-10-CM | POA: Diagnosis not present

## 2020-12-22 DIAGNOSIS — Z720 Tobacco use: Secondary | ICD-10-CM

## 2020-12-22 DIAGNOSIS — Z1231 Encounter for screening mammogram for malignant neoplasm of breast: Secondary | ICD-10-CM

## 2020-12-22 DIAGNOSIS — Z1211 Encounter for screening for malignant neoplasm of colon: Secondary | ICD-10-CM

## 2020-12-22 DIAGNOSIS — R002 Palpitations: Secondary | ICD-10-CM

## 2020-12-22 DIAGNOSIS — R768 Other specified abnormal immunological findings in serum: Secondary | ICD-10-CM

## 2020-12-22 DIAGNOSIS — R7989 Other specified abnormal findings of blood chemistry: Secondary | ICD-10-CM

## 2020-12-22 DIAGNOSIS — E559 Vitamin D deficiency, unspecified: Secondary | ICD-10-CM

## 2020-12-22 DIAGNOSIS — R030 Elevated blood-pressure reading, without diagnosis of hypertension: Secondary | ICD-10-CM

## 2020-12-22 DIAGNOSIS — J45909 Unspecified asthma, uncomplicated: Secondary | ICD-10-CM

## 2020-12-22 DIAGNOSIS — R21 Rash and other nonspecific skin eruption: Secondary | ICD-10-CM

## 2020-12-22 DIAGNOSIS — E663 Overweight: Secondary | ICD-10-CM

## 2020-12-22 DIAGNOSIS — Z79899 Other long term (current) drug therapy: Secondary | ICD-10-CM

## 2020-12-22 NOTE — Patient Instructions (Addendum)
Vanessa Lee , Thank you for taking time to come for your Annual Wellness Visit. I appreciate your ongoing commitment to your health goals. Please review the following plan we discussed and let me know if I can assist you in the future.   These are the goals we discussed: Goals    . Blood Pressure < 130/80    . LDL CALC < 100    . Quit Smoking       This is a list of the screening recommended for you and due dates:  Health Maintenance  Topic Date Due  . Pneumococcal Vaccination (1 of 4 - PCV13) Never done  . Hepatitis C Screening: USPSTF Recommendation to screen - Ages 2-79 yo.  Never done  . Colon Cancer Screening  Never done  . COVID-19 Vaccine (3 - Booster for Pfizer series) 08/13/2020  . Zoster (Shingles) Vaccine (1 of 2) 01/28/2021  . Flu Shot  02/14/2021  . Pap Smear  03/22/2021  . Tetanus Vaccine  02/08/2027  . HIV Screening  Completed  . HPV Vaccine  Aged Out     HOW TO SCHEDULE A MAMMOGRAM  [facility]  7 a.m.-6:30 p.m., Monday 7 a.m.-5 p.m., Tuesday-Friday Schedule an appointment by calling [phone].     HYPERTENSION INFORMATION  Monitor your blood pressure at home, please keep a record and bring that in with you to your next office visit.   Go to the ER if any CP, SOB, nausea, dizziness, severe HA, changes vision/speech  Testing/Procedures: HOW TO TAKE YOUR BLOOD PRESSURE:  Rest 5 minutes before taking your blood pressure.  Don't smoke or drink caffeinated beverages for at least 30 minutes before.  Take your blood pressure before (not after) you eat.  Sit comfortably with your back supported and both feet on the floor (don't cross your legs).  Elevate your arm to heart level on a table or a desk.  Use the proper sized cuff. It should fit smoothly and snugly around your bare upper arm. There should be enough room to slip a fingertip under the cuff. The bottom edge of the cuff should be 1 inch above the crease of the  elbow.  Due to a recent study, SPRINT, we have changed our goal for the systolic or top blood pressure number. Ideally we want your top number at 120.  In the [facility] Fall Trial, 5000 people were randomized to a goal BP of 120 and 5000 people were randomized to a goal BP of less than 140. The patients with the goal BP at 120 had LESS DEMENTIA, LESS HEART ATTACKS, AND LESS STROKES, AS WELL AS OVERALL DECREASED MORTALITY OR DEATH RATE.   There was another study that showed taking your blood pressure medications at night decrease cardiovascular events.  However if you are on a fluid pill, please take this in the morning.   If you are willing, our goal BP is the top number of 120.  Your most recent BP: BP: 132/78   Take your medications faithfully as instructed. Maintain a healthy weight. Get at least 150 minutes of aerobic exercise per week. Minimize salt intake. Minimize alcohol intake  DASH Eating Plan DASH stands for "Dietary Approaches to Stop Hypertension." The DASH eating plan is a healthy eating plan that has been shown to reduce high blood pressure (hypertension). Additional health benefits may include reducing the risk of type 2 diabetes mellitus, heart disease, and stroke. The DASH eating plan may also help with weight  loss. WHAT DO I NEED TO KNOW ABOUT THE DASH EATING PLAN? For the DASH eating plan, you will follow these general guidelines:  Choose foods with a percent daily value for sodium of less than 5% (as listed on the food label).  Use salt-free seasonings or herbs instead of table salt or sea salt.  Check with your health care provider or pharmacist before using salt substitutes.  Eat lower-sodium products, often labeled as "lower sodium" or "no salt added."  Eat fresh foods.  Eat more vegetables, fruits, and low-fat dairy products.  Choose whole grains. Look for the word "whole" as the first word in the ingredient list.  Choose fish and skinless chicken or [place] more  often than red meat. Limit fish, poultry, and meat to 6 oz (170 g) each day.  Limit sweets, desserts, sugars, and sugary drinks.  Choose heart-healthy fats.  Limit cheese to 1 oz (28 g) per day.  Eat more home-cooked food and less restaurant, buffet, and fast food.  Limit fried foods.  Cook foods using methods other than frying.  Limit canned vegetables. If you do use them, rinse them well to decrease the sodium.  When eating at a restaurant, ask that your food be prepared with less salt, or no salt if possible. WHAT FOODS CAN I EAT? Seek help from a dietitian for individual calorie needs. Grains Whole grain or whole wheat bread. Brown rice. Whole grain or whole wheat pasta. Quinoa, bulgur, and whole grain cereals. Low-sodium cereals. Corn or whole wheat flour tortillas. Whole grain cornbread. Whole grain crackers. Low-sodium crackers. Vegetables Fresh or frozen vegetables (raw, steamed, roasted, or grilled). Low-sodium or reduced-sodium tomato and vegetable juices. Low-sodium or reduced-sodium tomato sauce and paste. Low-sodium or reduced-sodium canned vegetables.  Fruits All fresh, canned (in natural juice), or frozen fruits. Meat and Other Protein Products Ground beef (85% or leaner), grass-fed beef, or beef trimmed of fat. Skinless chicken or [place]. Ground chicken or [place]. Pork trimmed of fat. All fish and seafood. Eggs. Dried beans, peas, or lentils. Unsalted nuts and seeds. Unsalted canned beans. Dairy Low-fat dairy products, such as skim or 1% milk, 2% or reduced-fat cheeses, low-fat ricotta or cottage cheese, or plain low-fat yogurt. Low-sodium or reduced-sodium cheeses. Fats and Oils Tub margarines without trans fats. Light or reduced-fat mayonnaise and salad dressings (reduced sodium). Avocado. Safflower, olive, or canola oils. Natural peanut or almond butter. Other Unsalted popcorn and pretzels. The items listed above may not be a complete list of recommended foods or  beverages. Contact your dietitian for more options. WHAT FOODS ARE NOT RECOMMENDED? Grains White bread. White pasta. White rice. Refined cornbread. Bagels and croissants. Crackers that contain trans fat. Vegetables Creamed or fried vegetables. Vegetables in a cheese sauce. Regular canned vegetables. Regular canned tomato sauce and paste. Regular tomato and vegetable juices. Fruits Dried fruits. Canned fruit in light or heavy syrup. Fruit juice. Meat and Other Protein Products Fatty cuts of meat. Ribs, chicken wings, bacon, sausage, bologna, salami, chitterlings, fatback, hot dogs, bratwurst, and packaged luncheon meats. Salted nuts and seeds. Canned beans with salt. Dairy Whole or 2% milk, cream, half-and-half, and cream cheese. Whole-fat or sweetened yogurt. Full-fat cheeses or blue cheese. Nondairy creamers and whipped toppings. Processed cheese, cheese spreads, or cheese curds. Condiments Onion and garlic salt, seasoned salt, table salt, and sea salt. Canned and packaged gravies. Worcestershire sauce. Tartar sauce. Barbecue sauce. Teriyaki sauce. Soy sauce, including reduced sodium. Steak sauce. Fish sauce. Oyster sauce. Cocktail sauce. Horseradish. Ketchup  and mustard. Meat flavorings and tenderizers. Bouillon cubes. Hot sauce. Tabasco sauce. Marinades. Taco seasonings. Relishes. Fats and Oils Butter, stick margarine, lard, shortening, ghee, and bacon fat. Coconut, palm kernel, or palm oils. Regular salad dressings. Other Pickles and olives. Salted popcorn and pretzels. The items listed above may not be a complete list of foods and beverages to avoid. Contact your dietitian for more information. WHERE CAN I FIND MORE INFORMATION? National Heart, Lung, and Blood Institute: [employer] Document Released: 06/22/2011 Document Revised: 11/17/2013 Document Reviewed: 05/07/2013 [facility] Patient Information 2015 [place], [place]. This information is not  intended to replace advice given to you by your health care provider. Make sure you discuss any questions you have with your health care provider.

## 2020-12-23 LAB — HEMOGLOBIN A1C
Hgb A1c MFr Bld: 5 % of total Hgb (ref <5.7)
Mean Plasma Glucose: 97 mg/dL
eAG (mmol/L): 5.4 mmol/L

## 2020-12-23 LAB — URINALYSIS, ROUTINE W REFLEX MICROSCOPIC
Bilirubin Urine: NEGATIVE
Glucose, UA: NEGATIVE
Hgb urine dipstick: NEGATIVE
Ketones, ur: NEGATIVE
Leukocytes,Ua: NEGATIVE
Nitrite: NEGATIVE
Protein, ur: NEGATIVE
Specific Gravity, Urine: 1.007 (ref 1.001–1.035)
pH: 6 (ref 5.0–8.0)

## 2020-12-23 LAB — CBC WITH DIFFERENTIAL/PLATELET
Absolute Monocytes: 592 cells/uL (ref 200–950)
Basophils Absolute: 61 cells/uL (ref 0–200)
Basophils Relative: 1.3 %
Eosinophils Absolute: 71 cells/uL (ref 15–500)
Eosinophils Relative: 1.5 %
HCT: 39.4 % (ref 35.0–45.0)
Hemoglobin: 13.2 g/dL (ref 11.7–15.5)
Lymphs Abs: 1095 cells/uL (ref 850–3900)
MCH: 30.8 pg (ref 27.0–33.0)
MCHC: 33.5 g/dL (ref 32.0–36.0)
MCV: 92.1 fL (ref 80.0–100.0)
MPV: 9.5 fL (ref 7.5–12.5)
Monocytes Relative: 12.6 %
Neutro Abs: 2881 cells/uL (ref 1500–7800)
Neutrophils Relative %: 61.3 %
Platelets: 361 10*3/uL (ref 140–400)
RBC: 4.28 10*6/uL (ref 3.80–5.10)
RDW: 14.2 % (ref 11.0–15.0)
Total Lymphocyte: 23.3 %
WBC: 4.7 10*3/uL (ref 3.8–10.8)

## 2020-12-23 LAB — COMPLETE METABOLIC PANEL WITH GFR
AG Ratio: 1.7 (calc) (ref 1.0–2.5)
ALT: 24 U/L (ref 6–29)
AST: 64 U/L — ABNORMAL HIGH (ref 10–35)
Albumin: 4.3 g/dL (ref 3.6–5.1)
Alkaline phosphatase (APISO): 55 U/L (ref 31–125)
BUN: 8 mg/dL (ref 7–25)
CO2: 29 mmol/L (ref 20–32)
Calcium: 9.2 mg/dL (ref 8.6–10.2)
Chloride: 102 mmol/L (ref 98–110)
Creat: 0.56 mg/dL (ref 0.50–1.10)
GFR, Est African American: 127 mL/min/{1.73_m2} (ref >=60)
GFR, Est Non African American: 109 mL/min/{1.73_m2} (ref >=60)
Globulin: 2.6 g/dL (calc) (ref 1.9–3.7)
Glucose, Bld: 87 mg/dL (ref 65–99)
Potassium: 4.4 mmol/L (ref 3.5–5.3)
Sodium: 139 mmol/L (ref 135–146)
Total Bilirubin: 0.3 mg/dL (ref 0.2–1.2)
Total Protein: 6.9 g/dL (ref 6.1–8.1)

## 2020-12-23 LAB — LIPID PANEL
Cholesterol: 261 mg/dL — ABNORMAL HIGH (ref <200)
HDL: 127 mg/dL (ref >=50)
LDL Cholesterol (Calc): 120 mg/dL (calc) — ABNORMAL HIGH
Non-HDL Cholesterol (Calc): 134 mg/dL (calc) — ABNORMAL HIGH (ref <130)
Total CHOL/HDL Ratio: 2.1 (calc) (ref <5.0)
Triglycerides: 58 mg/dL (ref <150)

## 2020-12-23 LAB — TSH: TSH: 0.65 mIU/L

## 2020-12-23 LAB — VITAMIN D 25 HYDROXY (VIT D DEFICIENCY, FRACTURES): Vit D, 25-Hydroxy: 53 ng/mL (ref 30–100)

## 2020-12-23 NOTE — Addendum Note (Signed)
Addended by: Dan Maker on: 12/23/2020 07:41 AM   Modules accepted: Orders

## 2020-12-24 ENCOUNTER — Other Ambulatory Visit: Payer: Self-pay | Admitting: Adult Health

## 2020-12-24 DIAGNOSIS — R7989 Other specified abnormal findings of blood chemistry: Secondary | ICD-10-CM

## 2021-01-23 ENCOUNTER — Other Ambulatory Visit: Payer: Self-pay

## 2021-01-24 MED ORDER — AMPHETAMINE-DEXTROAMPHETAMINE 20 MG PO TABS: ORAL_TABLET | ORAL | 0 refills | Status: DC

## 2021-02-01 NOTE — Progress Notes (Addendum)
Office Visit Note  Patient: Vanessa Lee             Date of Birth: 1971/04/02           MRN: [id number]             PCP: Unk Pinto, MD Referring: Liane Comber, NP Visit Date: 02/15/2021 Occupation: _0 @  Subjective:  Pain in multiple joints and muscles.   History of Present Illness: Vanessa Lee is a 50 y.o. female seen in consultation per request of her PCP.  According the patient she has had pain all over her body since she was in her 50s.  She states that it has always been painful for her for somebody to touch her.  She did not like massage treatment.  For the last few years she has been having increased pain and discomfort in her hands, knee joints and her feet.  She has noticed intermittent swelling in her hands and feet.  She also gives history of Raynaud's phenomenon.  She gives history of intermittent rash on her face and her chest and sometimes on her extremities.  She states that she gets oral ulcers when she has a sinus infection.  She states she has had multiple upper respiratory respiratory tract infections and had multiple COVID-19 test.  There is no history of nasal ulcers, malar rash, photosensitivity or lymphadenopathy.  She gives a history of dry mouth and dry eyes and raynaud's phenominon.  She states her hands turn white when she is exposed to the colder temperatures.  She has not seen bluish discoloration in her hands.  There is history of rheumatoid arthritis in paternal grandmother.  She is gravida 3, para 2, abortion 1.  There is no history of DVTs.  Activities of Daily Living:  Patient reports morning stiffness for 1 hour.   Patient Reports nocturnal pain.  Difficulty dressing/grooming: Denies Difficulty climbing stairs: Reports Difficulty getting out of chair: Reports Difficulty using hands for taps, buttons, cutlery, and/or writing: Reports  Review of Systems  Constitutional:  Positive for fatigue.  HENT:  Positive for mouth sores, mouth  dryness and nose dryness.        Nose sores   Eyes:  Positive for dryness. Negative for pain and itching.  Respiratory:  Negative for shortness of breath and difficulty breathing.   Cardiovascular:  Positive for palpitations. Negative for chest pain.  Gastrointestinal:  Negative for blood in stool, constipation and diarrhea.  Endocrine: Negative for increased urination.  Genitourinary:  Negative for difficulty urinating.  Musculoskeletal:  Positive for joint pain, joint pain, joint swelling, myalgias, morning stiffness, muscle tenderness and myalgias.  Skin:  Positive for color change and rash. Negative for sensitivity to sunlight.  Allergic/Immunologic: Positive for susceptible to infections.  Neurological:  Positive for numbness, headaches and weakness. Negative for dizziness.  Hematological:  Positive for bruising/bleeding tendency. Negative for swollen glands.  Psychiatric/Behavioral:  Positive for depressed mood and sleep disturbance. The patient is nervous/anxious.    PMFS History:  Patient Active Problem List   Diagnosis Date Noted   Vapes nicotine containing substance 12/22/2020   Elevated BP without diagnosis of hypertension 12/22/2020   Overweight (BMI 25.0-29.9) 12/20/2020   Vitamin D deficiency 12/20/2020   Hyperlipidemia 02/07/2018   Tobacco use disorder 09/30/2014   Major depression in remission ([facility])    GERD (gastroesophageal reflux disease)    ADD (attention deficit disorder)    Asthma     Past Medical History:  Diagnosis Date   ADD (  attention deficit disorder)    Anemia    Asthma    Depression    GERD (gastroesophageal reflux disease)     Family History  Problem Relation Age of Onset   Cancer Mother        breast   Diabetes Father    Hypertension Father    Lung cancer Maternal Uncle 73   Breast cancer Maternal Grandmother    Brain cancer Maternal Grandmother    Lung cancer Maternal Grandfather        metastatic   Rheum arthritis Paternal Grandmother     Heart attack Paternal Grandfather 3   ADD / ADHD Daughter    Hearing loss Daughter    Learning disabilities Daughter    Learning disabilities Son    ADD / ADHD Son    Depression Son    Past Surgical History:  Procedure Laterality Date   ADENOIDECTOMY Bilateral    with tonsillectomy   BLADDER SURGERY     As a child, urethral dilation   BREAST CYST EXCISION  1994   CESAREAN SECTION     EXTERNAL EAR SURGERY     INNER EAR SURGERY     mastoid surgery     MIDDLE EAR SURGERY     Social History   Social History Narrative   Not on file   Immunization History  Administered Date(s) Administered   PFIZER(Purple Top)SARS-COV-2 Vaccination 02/22/2020, 03/13/2020   Tdap 02/07/2017     Objective: Vital Signs: BP (!) 139/93 (BP Location: Right Arm, Patient Position: Sitting, Cuff Size: Normal)   Pulse 85   Ht _0  (1.626 m)   Wt 169 lb (76.7 kg)   BMI 29.01 kg/m    Physical Exam Vitals and nursing note reviewed.  Constitutional:      Appearance: She is well-developed.  HENT:     Head: Normocephalic and atraumatic.  Eyes:     Conjunctiva/sclera: Conjunctivae normal.  Cardiovascular:     Rate and Rhythm: Normal rate and regular rhythm.     Heart sounds: Normal heart sounds.  Pulmonary:     Effort: Pulmonary effort is normal.     Breath sounds: Normal breath sounds.  Abdominal:     General: Bowel sounds are normal.     Palpations: Abdomen is soft.  Musculoskeletal:     Cervical back: Normal range of motion.  Lymphadenopathy:     Cervical: No cervical adenopathy.  Skin:    General: Skin is warm and dry.     Capillary Refill: Capillary refill takes less than 2 seconds.  Neurological:     Mental Status: She is alert and oriented to person, place, and time.  Psychiatric:        Behavior: Behavior normal.     Musculoskeletal Exam: C-spine thoracic and lumbar spine were in good range of motion.  Shoulder joints, elbow joints, wrist joints with good range of motion.   She had bilateral PIP and DIP thickening.  Hip joints, knee joints, ankles, MTPs and PIPs with good range of motion.  She had discomfort range of motion of bilateral knee joints without any warmth swelling or effusion.  She had bilateral PIP and DIP prominence without synovitis.  She had generalized hyperalgesia and positive tender points.  She had bilateral trapezius spasm and lateral epicondyle tenderness.  CDAI Exam: CDAI Score: -- Patient Global: --; Provider Global: -- Swollen: --; Tender: -- Joint Exam 02/15/2021   No joint exam has been documented for this visit   There is  currently no information documented on the homunculus. Go to the Rheumatology activity and complete the homunculus joint exam.  Investigation: No additional findings.  Imaging: No results found.  Recent Labs: Lab Results  Component Value Date   WBC 4.7 12/22/2020   HGB 13.2 12/22/2020   PLT 361 12/22/2020   NA 139 12/22/2020   K 4.4 12/22/2020   CL 102 12/22/2020   CO2 29 12/22/2020   GLUCOSE 87 12/22/2020   BUN 8 12/22/2020   CREATININE 0.56 12/22/2020   BILITOT 0.3 12/22/2020   ALKPHOS 55 01/24/2017   AST 64 (H) 12/22/2020   ALT 24 12/22/2020   PROT 6.9 12/22/2020   ALBUMIN 4.3 01/24/2017   CALCIUM 9.2 12/22/2020   GFRAA 127 12/22/2020    Speciality Comments: No specialty comments available.  Procedures:  No procedures performed Allergies: Codeine and Penicillins   Assessment / Plan:     Visit Diagnoses: Positive ANA (antinuclear antibody) - 12/05/19: ANA 1:80NS, dsDNA 4, RF<14, ESR 10.  She has low titer positive ANA.  She gives history of facial rash, Raynaud's phenomenon, sicca symptoms and joint pain.  She had good capillary refill on my examination.  No nailbed capillary changes were noted.She has facial erythema and also erythema over her neck.  No typical malar rash was noted.  Pain in both hands -she complains of pain in bilateral hands.  No synovitis was noted.  She had bilateral  PIP and DIP thickening.  Plan: XR Hand 2 View Right, XR Hand 2 View Left.  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  Lateral epicondylitis of both elbows-she had tenderness over bilateral lateral epicondyle region.  Chronic pain of both knees -she complains of pain and discomfort in her bilateral knee joints.  No warmth swelling or effusion was noted.  Plan: XR KNEE 3 VIEW RIGHT, XR KNEE 3 VIEW LEFT.  X-ray showed bilateral mild chondromalacia patella.  Pain in both feet -she complains of discomfort in her bilateral feet.  No synovitis was noted.  Plan: XR Foot 2 Views Right, XR Foot 2 Views Left.  X-rays were consistent with early osteoarthritis of the feet.  Polyarthralgia  Myalgia-she has generalized hyperalgesia and positive tender points.  Trapezius muscle spasm-she has trapezius spasm bilaterally.  Rash-she complains of rash on her face and her neck.  She has generalized erythema on her face and neck but not typical malar rash.  I advised appointment with the dermatologist.  Other medical problems are listed as follows:  Vitamin D deficiency  History of hyperlipidemia  Gastroesophageal reflux disease without esophagitis  History of asthma  Vapes nicotine containing substance  Tobacco use disorder  Major depression in remission ([place])  Orders: Orders Placed This Encounter  Procedures   XR Hand 2 View Right   XR Hand 2 View Left   XR KNEE 3 VIEW RIGHT   XR KNEE 3 VIEW LEFT   XR Foot 2 Views Right   XR Foot 2 Views Left   AST   CK   Sedimentation rate   Serum protein electrophoresis with reflex    No orders of the defined types were placed in this encounter.    Follow-Up Instructions: Return for   Positive ANA, joint pain, myalgias .   Bo Merino, MD  Note - This record has been created using Editor, commissioning.  Chart creation errors have been sought, but may not always  have been located. Such creation errors do not reflect on  the  standard of  medical care.

## 2021-02-15 ENCOUNTER — Ambulatory Visit: Payer: Self-pay

## 2021-02-15 ENCOUNTER — Ambulatory Visit (INDEPENDENT_AMBULATORY_CARE_PROVIDER_SITE_OTHER): Payer: BC Managed Care – PPO | Admitting: Rheumatology

## 2021-02-15 ENCOUNTER — Ambulatory Visit: Payer: BC Managed Care – PPO

## 2021-02-15 ENCOUNTER — Other Ambulatory Visit: Payer: Self-pay

## 2021-02-15 ENCOUNTER — Encounter: Payer: Self-pay | Admitting: Rheumatology

## 2021-02-15 VITALS — BP 139/93 | HR 85 | Ht 64.0 in | Wt 169.0 lb

## 2021-02-15 DIAGNOSIS — K219 Gastro-esophageal reflux disease without esophagitis: Secondary | ICD-10-CM

## 2021-02-15 DIAGNOSIS — M7711 Lateral epicondylitis, right elbow: Secondary | ICD-10-CM

## 2021-02-15 DIAGNOSIS — E559 Vitamin D deficiency, unspecified: Secondary | ICD-10-CM

## 2021-02-15 DIAGNOSIS — Z8639 Personal history of other endocrine, nutritional and metabolic disease: Secondary | ICD-10-CM

## 2021-02-15 DIAGNOSIS — R768 Other specified abnormal immunological findings in serum: Secondary | ICD-10-CM

## 2021-02-15 DIAGNOSIS — F17200 Nicotine dependence, unspecified, uncomplicated: Secondary | ICD-10-CM

## 2021-02-15 DIAGNOSIS — M79672 Pain in left foot: Secondary | ICD-10-CM | POA: Diagnosis not present

## 2021-02-15 DIAGNOSIS — R21 Rash and other nonspecific skin eruption: Secondary | ICD-10-CM

## 2021-02-15 DIAGNOSIS — M79641 Pain in right hand: Secondary | ICD-10-CM | POA: Diagnosis not present

## 2021-02-15 DIAGNOSIS — M25562 Pain in left knee: Secondary | ICD-10-CM | POA: Diagnosis not present

## 2021-02-15 DIAGNOSIS — R7989 Other specified abnormal findings of blood chemistry: Secondary | ICD-10-CM

## 2021-02-15 DIAGNOSIS — M79642 Pain in left hand: Secondary | ICD-10-CM

## 2021-02-15 DIAGNOSIS — F325 Major depressive disorder, single episode, in full remission: Secondary | ICD-10-CM

## 2021-02-15 DIAGNOSIS — M791 Myalgia, unspecified site: Secondary | ICD-10-CM

## 2021-02-15 DIAGNOSIS — M25561 Pain in right knee: Secondary | ICD-10-CM

## 2021-02-15 DIAGNOSIS — G8929 Other chronic pain: Secondary | ICD-10-CM

## 2021-02-15 DIAGNOSIS — Z8709 Personal history of other diseases of the respiratory system: Secondary | ICD-10-CM

## 2021-02-15 DIAGNOSIS — M79671 Pain in right foot: Secondary | ICD-10-CM | POA: Diagnosis not present

## 2021-02-15 DIAGNOSIS — M62838 Other muscle spasm: Secondary | ICD-10-CM

## 2021-02-15 DIAGNOSIS — M2550 Pain in unspecified joint: Secondary | ICD-10-CM

## 2021-02-15 DIAGNOSIS — Z720 Tobacco use: Secondary | ICD-10-CM

## 2021-02-15 DIAGNOSIS — M7712 Lateral epicondylitis, left elbow: Secondary | ICD-10-CM

## 2021-02-15 NOTE — Patient Instructions (Signed)
Please a schedule an appointment with the dermatologist for evaluation of the rash.

## 2021-02-17 ENCOUNTER — Other Ambulatory Visit: Payer: Self-pay

## 2021-02-17 DIAGNOSIS — R768 Other specified abnormal immunological findings in serum: Secondary | ICD-10-CM | POA: Diagnosis not present

## 2021-02-17 DIAGNOSIS — F411 Generalized anxiety disorder: Secondary | ICD-10-CM

## 2021-02-17 DIAGNOSIS — M2550 Pain in unspecified joint: Secondary | ICD-10-CM | POA: Diagnosis not present

## 2021-02-17 DIAGNOSIS — R21 Rash and other nonspecific skin eruption: Secondary | ICD-10-CM | POA: Diagnosis not present

## 2021-02-17 LAB — CK: Total CK: 63 U/L (ref 29–143)

## 2021-02-17 LAB — PROTEIN ELECTROPHORESIS, SERUM, WITH REFLEX
Albumin ELP: 4.6 g/dL (ref 3.8–4.8)
Alpha 1: 0.3 g/dL (ref 0.2–0.3)
Alpha 2: 0.7 g/dL (ref 0.5–0.9)
Beta 2: 0.3 g/dL (ref 0.2–0.5)
Beta Globulin: 0.5 g/dL (ref 0.4–0.6)
Gamma Globulin: 0.9 g/dL (ref 0.8–1.7)
Total Protein: 7.4 g/dL (ref 6.1–8.1)

## 2021-02-17 LAB — SEDIMENTATION RATE: Sed Rate: 6 mm/h (ref 0–20)

## 2021-02-17 LAB — AST: AST: 59 U/L — ABNORMAL HIGH (ref 10–35)

## 2021-02-17 MED ORDER — ALPRAZOLAM 0.5 MG PO TABS: 0.5000 mg | ORAL_TABLET | Freq: Two times a day (BID) | ORAL | 0 refills | Status: DC | PRN

## 2021-02-18 NOTE — Progress Notes (Signed)
SPEP is normal, sed rate is normal, CK is normal, AST is a still elevated.  We are not prescribing any medications.  She may discuss elevated AST with her PCP.  She may need evaluation by gastroenterology.  Please advise avoiding all NSAIDs and alcohol use.

## 2021-02-19 ENCOUNTER — Other Ambulatory Visit: Payer: Self-pay | Admitting: Adult Health

## 2021-02-19 MED ORDER — FAMOTIDINE 20 MG PO TABS: ORAL_TABLET | ORAL | 1 refills | Status: DC

## 2021-03-03 ENCOUNTER — Ambulatory Visit: Payer: BC Managed Care – PPO | Admitting: Adult Health

## 2021-03-06 NOTE — Progress Notes (Signed)
Office Visit Note  Patient: Vanessa Lee             Date of Birth: 13-Jan-1971           MRN: [id number]             PCP: Unk Pinto, MD Referring: Unk Pinto, MD Visit Date: 03/14/2021 Occupation: @GUAROCC @  Subjective:  Pain in multiple joints and muscles.   History of Present Illness: Vanessa Lee is a 50 y.o. female with a history of osteoarthritis and myofascial pain.  She states she continues to have discomfort in multiple joints and muscles.  She has not seen any joint swelling.  She continues to have some rash on her face and under her breast.  She has an appointment coming up with the dermatologist.  She experiences discomfort in her knee joints when she climbs the stairs.  Activities of Daily Living:  Patient reports morning stiffness for 1 hour.   Patient Reports nocturnal pain.  Difficulty dressing/grooming: Denies Difficulty climbing stairs: Denies Difficulty getting out of chair: Reports Difficulty using hands for taps, buttons, cutlery, and/or writing: Reports  Review of Systems  Constitutional:  Positive for fatigue.  HENT:  Positive for mouth dryness and nose dryness. Negative for mouth sores.   Eyes:  Positive for dryness. Negative for pain and itching.  Respiratory:  Negative for shortness of breath and difficulty breathing.   Cardiovascular:  Positive for palpitations. Negative for chest pain.  Gastrointestinal:  Negative for blood in stool, constipation and diarrhea.  Endocrine: Negative for increased urination.  Genitourinary:  Negative for difficulty urinating.  Musculoskeletal:  Positive for joint pain, joint pain, joint swelling, myalgias, morning stiffness, muscle tenderness and myalgias.  Skin:  Positive for color change and rash.  Allergic/Immunologic: Positive for susceptible to infections.  Neurological:  Positive for numbness, headaches, memory loss and weakness. Negative for dizziness.  Hematological:  Positive for  bruising/bleeding tendency.  Psychiatric/Behavioral:  Negative for confusion.    PMFS History:  Patient Active Problem List   Diagnosis Date Noted   Vapes nicotine containing substance 12/22/2020   Elevated BP without diagnosis of hypertension 12/22/2020   Overweight (BMI 25.0-29.9) 12/20/2020   Vitamin D deficiency 12/20/2020   Hyperlipidemia 02/07/2018   Tobacco use disorder 09/30/2014   Major depression in remission ([place])    GERD (gastroesophageal reflux disease)    ADD (attention deficit disorder)    Asthma     Past Medical History:  Diagnosis Date   ADD (attention deficit disorder)    Anemia    Asthma    Depression    GERD (gastroesophageal reflux disease)     Family History  Problem Relation Age of Onset   Cancer Mother        breast   Diabetes Father    Hypertension Father    Lung cancer Maternal Uncle 17   Breast cancer Maternal Grandmother    Brain cancer Maternal Grandmother    Lung cancer Maternal Grandfather        metastatic   Rheum arthritis Paternal Grandmother    Heart attack Paternal Grandfather 8   ADD / ADHD Daughter    Hearing loss Daughter    Learning disabilities Daughter    Learning disabilities Son    ADD / ADHD Son    Depression Son    Past Surgical History:  Procedure Laterality Date   ADENOIDECTOMY Bilateral    with tonsillectomy   BLADDER SURGERY     As a child, urethral dilation  BREAST CYST EXCISION  1994   CESAREAN SECTION     EXTERNAL EAR SURGERY     INNER EAR SURGERY     mastoid surgery     MIDDLE EAR SURGERY     Social History   Social History Narrative   Not on file   Immunization History  Administered Date(s) Administered   PFIZER(Purple Top)SARS-COV-2 Vaccination 02/22/2020, 03/13/2020   Tdap 02/07/2017     Objective: Vital Signs: BP 131/89 (BP Location: Left Arm, Patient Position: Sitting, Cuff Size: Normal)   Pulse 88   Ht $R'5\' 4"'pi$  (1.626 m)   Wt 168 lb 12.8 oz (76.6 kg)   BMI 28.97 kg/m    Physical  Exam Vitals and nursing note reviewed.  Constitutional:      Appearance: She is well-developed.  HENT:     Head: Normocephalic and atraumatic.  Eyes:     Conjunctiva/sclera: Conjunctivae normal.  Cardiovascular:     Rate and Rhythm: Normal rate and regular rhythm.     Heart sounds: Normal heart sounds.  Pulmonary:     Effort: Pulmonary effort is normal.     Breath sounds: Normal breath sounds.  Abdominal:     General: Bowel sounds are normal.     Palpations: Abdomen is soft.  Musculoskeletal:     Cervical back: Normal range of motion.  Lymphadenopathy:     Cervical: No cervical adenopathy.  Skin:    General: Skin is warm and dry.     Capillary Refill: Capillary refill takes less than 2 seconds.     Comments: Generalized facial erythema was noted.  Also some redness was noted on her neck.  Neurological:     Mental Status: She is alert and oriented to person, place, and time.  Psychiatric:        Behavior: Behavior normal.     Musculoskeletal Exam: C-spine was in good range of motion.  Shoulder joints, elbow joints, wrist joints with good range of motion.  She had PIP and DIP thickening in her hands consistent with osteoarthritis.  No synovitis was noted.  Hip joints and knee joints with good range of motion.  There was no tenderness over ankles or MTPs.  She had some crepitus in her knee joints without any warmth swelling or effusion.  CDAI Exam: CDAI Score: -- Patient Global: --; Provider Global: -- Swollen: --; Tender: -- Joint Exam 03/14/2021   No joint exam has been documented for this visit   There is currently no information documented on the homunculus. Go to the Rheumatology activity and complete the homunculus joint exam.  Investigation: No additional findings.  Imaging: XR Foot 2 Views Left  Result Date: 02/15/2021 First MTP, PIP and DIP narrowing was noted.  No intertarsal, tibiotalar or subtalar joint space narrowing was noted.  No erosive changes were  noted. Impression: These findings are consistent with osteoarthritis of the foot.  XR Foot 2 Views Right  Result Date: 02/15/2021 First MTP, PIP and DIP narrowing was noted.  No intertarsal, tibiotalar or subtalar joint space narrowing was noted. Impression: These findings are consistent with osteoarthritis of the foot.  XR Hand 2 View Left  Result Date: 02/15/2021 CMC, PIP and DIP narrowing was noted.  Subluxation of some of the DIP joints was noted.  No intercarpal radiocarpal joint space narrowing was noted.  No erosive changes were noted. Impression: These findings are consistent with osteoarthritis of the hand.  XR Hand 2 View Right  Result Date: 02/15/2021 CMC, PIP and DIP  narrowing was noted.  No erosive changes were noted.  No intercarpal or radiocarpal joint space narrowing was noted. Impression: These findings are consistent with osteoarthritis of the thumb.  XR KNEE 3 VIEW LEFT  Result Date: 02/15/2021 No medial or lateral compartment narrowing was noted.  Mild patellofemoral narrowing was noted.  No chondrocalcinosis was noted. Impression: These findings are consistent with mild chondromalacia patella of the knee.  XR KNEE 3 VIEW RIGHT  Result Date: 02/15/2021 No medial or lateral compartment narrowing was noted.  Mild patellofemoral narrowing was noted.  No chondrocalcinosis was noted. Impression: These findings are consistent with mild chondromalacia patella of the knee.   Recent Labs: Lab Results  Component Value Date   WBC 4.7 12/22/2020   HGB 13.2 12/22/2020   PLT 361 12/22/2020   NA 139 12/22/2020   K 4.4 12/22/2020   CL 102 12/22/2020   CO2 29 12/22/2020   GLUCOSE 87 12/22/2020   BUN 8 12/22/2020   CREATININE 0.56 12/22/2020   BILITOT 0.3 12/22/2020   ALKPHOS 55 01/24/2017   AST 59 (H) 02/15/2021   ALT 24 12/22/2020   PROT 7.4 02/15/2021   ALBUMIN 4.3 01/24/2017   CALCIUM 9.2 12/22/2020   GFRAA 127 12/22/2020    February 17, 2021 AVISE lupus index -0.7, ANA 1:  80 speckled, ENA negative, CB CAP negative, anticardiolipin negative, beta-2 GP 1 negative, antiphosphatidylserine negative, antihistone weak positive, RF negative, anti-CCP negative, anticar P negative, antithyroglobulin negative, anti-TPO negative  February 15, 2021 SPEP normal, CK 63, sed rate 6, AST 59  December 22, 2020 AST 64  12/05/19: ANA 1:80NS, dsDNA 4, RF<14, ESR 10.   Speciality Comments: No specialty comments available.  Procedures:  No procedures performed Allergies: Codeine and Penicillins   Assessment / Plan:     Visit Diagnoses: Primary osteoarthritis of both hands - Clinical and radiographic findings are consistent with osteoarthritis.  X-ray findings were discussed with the patient.  Needle counts regarding osteoarthritis was provided.  Joint protection muscle strengthening was discussed.  Lateral epicondylitis of both elbows-forearm exercises was explained.  Chondromalacia of both patellae - X-ray showed mild chondromalacia patella.  She has been experiencing discomfort in her knee joints when she climbs the stairs.  Detailed counseling guarding chondromalacia patella was provided.  A handout on lower extremity muscle exercises were discussed.  Primary osteoarthritis of both feet - Early osteoarthritic changes were noted on the x-rays.  Proper fitting shoes were discussed.  Positive ANA (antinuclear antibody) - Dec 05, 2019 ANA 1: 80NS, dsDNA negative, ESR 10.  Patient gives history of Raynauds, arthralgias and sicca symptoms.Plan AVISE labs were discussed at length.  Besides positive ANA all other antibody test were negative.  She does not meet criteria for any particular autoimmune disease at this time.  Her hands were warm to touch with good capillary refill.  No sclerodactyly or nailbed capillary changes were noted.  Myofascial pain - She has generalized pain, positive tender points and hyperalgesia.  Her findings are consistent with fibromyalgia.  Detailed counseling was  provided.  Need for regular exercise and stretching was emphasized.  Water aerobics was encouraged.  Trapezius muscle spasm-due to muscle spasm.  Rash -she has an appointment coming up with the dermatologist.  Vitamin D deficiency  Elevated LFTs-she will follow-up with her PCP.  History of hyperlipidemia  Gastroesophageal reflux disease without esophagitis  History of asthma  Major depression in remission ([place])  Vapes nicotine containing substance  Tobacco use disorder  Orders: No orders  of the defined types were placed in this encounter.  No orders of the defined types were placed in this encounter.    Follow-Up Instructions: Return in about 1 year (around 03/14/2022) for Osteoarthritis.   Bo Merino, MD  Note - This record has been created using Editor, commissioning.  Chart creation errors have been sought, but may not always  have been located. Such creation errors do not reflect on  the standard of medical care.

## 2021-03-12 ENCOUNTER — Other Ambulatory Visit: Payer: Self-pay

## 2021-03-13 MED ORDER — AMPHETAMINE-DEXTROAMPHETAMINE 20 MG PO TABS: ORAL_TABLET | ORAL | 0 refills | Status: DC

## 2021-03-14 ENCOUNTER — Ambulatory Visit (INDEPENDENT_AMBULATORY_CARE_PROVIDER_SITE_OTHER): Payer: BC Managed Care – PPO | Admitting: Rheumatology

## 2021-03-14 ENCOUNTER — Other Ambulatory Visit: Payer: Self-pay

## 2021-03-14 ENCOUNTER — Encounter: Payer: Self-pay | Admitting: Rheumatology

## 2021-03-14 VITALS — BP 131/89 | HR 88 | Ht 64.0 in | Wt 168.8 lb

## 2021-03-14 DIAGNOSIS — M19042 Primary osteoarthritis, left hand: Secondary | ICD-10-CM

## 2021-03-14 DIAGNOSIS — M2242 Chondromalacia patellae, left knee: Secondary | ICD-10-CM

## 2021-03-14 DIAGNOSIS — Z8709 Personal history of other diseases of the respiratory system: Secondary | ICD-10-CM

## 2021-03-14 DIAGNOSIS — E559 Vitamin D deficiency, unspecified: Secondary | ICD-10-CM

## 2021-03-14 DIAGNOSIS — R768 Other specified abnormal immunological findings in serum: Secondary | ICD-10-CM

## 2021-03-14 DIAGNOSIS — Z720 Tobacco use: Secondary | ICD-10-CM

## 2021-03-14 DIAGNOSIS — Z8639 Personal history of other endocrine, nutritional and metabolic disease: Secondary | ICD-10-CM

## 2021-03-14 DIAGNOSIS — F325 Major depressive disorder, single episode, in full remission: Secondary | ICD-10-CM

## 2021-03-14 DIAGNOSIS — R7989 Other specified abnormal findings of blood chemistry: Secondary | ICD-10-CM

## 2021-03-14 DIAGNOSIS — M19071 Primary osteoarthritis, right ankle and foot: Secondary | ICD-10-CM | POA: Diagnosis not present

## 2021-03-14 DIAGNOSIS — M2241 Chondromalacia patellae, right knee: Secondary | ICD-10-CM | POA: Diagnosis not present

## 2021-03-14 DIAGNOSIS — R21 Rash and other nonspecific skin eruption: Secondary | ICD-10-CM

## 2021-03-14 DIAGNOSIS — M7711 Lateral epicondylitis, right elbow: Secondary | ICD-10-CM | POA: Diagnosis not present

## 2021-03-14 DIAGNOSIS — M19041 Primary osteoarthritis, right hand: Secondary | ICD-10-CM

## 2021-03-14 DIAGNOSIS — F17200 Nicotine dependence, unspecified, uncomplicated: Secondary | ICD-10-CM

## 2021-03-14 DIAGNOSIS — M7918 Myalgia, other site: Secondary | ICD-10-CM

## 2021-03-14 DIAGNOSIS — M19072 Primary osteoarthritis, left ankle and foot: Secondary | ICD-10-CM

## 2021-03-14 DIAGNOSIS — M7712 Lateral epicondylitis, left elbow: Secondary | ICD-10-CM

## 2021-03-14 DIAGNOSIS — M62838 Other muscle spasm: Secondary | ICD-10-CM

## 2021-03-14 DIAGNOSIS — K219 Gastro-esophageal reflux disease without esophagitis: Secondary | ICD-10-CM

## 2021-03-14 NOTE — Patient Instructions (Signed)
Journal for Nurse Practitioners, 15(4), 263-267. Retrieved April 22, 2018 from [email or web address]">  Knee Exercises Ask your health care provider which exercises are safe for you. Do exercises exactly as told by your health care provider and adjust them as directed. It is normal to feel mild stretching, pulling, tightness, or discomfort as you do these exercises. Stop right away if you feel sudden pain or your pain gets worse. Do not begin these exercises until told by your health care provider. Stretching and range-of-motion exercises These exercises warm up your muscles and joints and improve the movement and flexibility of your knee. These exercises also help to relieve pain andswelling. Knee extension, prone Lie on your abdomen (prone position) on a bed. Place your left / right knee just beyond the edge of the surface so your knee is not on the bed. You can put a towel under your left / right thigh just above your kneecap for comfort. Relax your leg muscles and allow gravity to straighten your knee (extension). You should feel a stretch behind your left / right knee. Hold this position for __________ seconds. Scoot up so your knee is supported between repetitions. Repeat __________ times. Complete this exercise __________ times a day. Knee flexion, active  Lie on your back with both legs straight. If this causes back discomfort, bend your left / right knee so your foot is flat on the floor. Slowly slide your left / right heel back toward your buttocks. Stop when you feel a gentle stretch in the front of your knee or thigh (flexion). Hold this position for __________ seconds. Slowly slide your left / right heel back to the starting position. Repeat __________ times. Complete this exercise __________ times a day. Quadriceps stretch, prone  Lie on your abdomen on a firm surface, such as a bed or padded floor. Bend your left / right knee and hold your ankle. If you cannot reach  your ankle or pant leg, loop a belt around your foot and grab the belt instead. Gently pull your heel toward your buttocks. Your knee should not slide out to the side. You should feel a stretch in the front of your thigh and knee (quadriceps). Hold this position for __________ seconds. Repeat __________ times. Complete this exercise __________ times a day. Hamstring, supine Lie on your back (supine position). Loop a belt or towel over the ball of your left / right foot. The ball of your foot is on the walking surface, right under your toes. Straighten your left / right knee and slowly pull on the belt to raise your leg until you feel a gentle stretch behind your knee (hamstring). Do not let your knee bend while you do this. Keep your other leg flat on the floor. Hold this position for __________ seconds. Repeat __________ times. Complete this exercise __________ times a day. Strengthening exercises These exercises build strength and endurance in your knee. Endurance is theability to use your muscles for a long time, even after they get tired. Quadriceps, isometric This exercise stretches the muscles in front of your thigh (quadriceps) without moving your knee joint (isometric). Lie on your back with your left / right leg extended and your other knee bent. Put a rolled towel or small pillow under your knee if told by your health care provider. Slowly tense the muscles in the front of your left / right thigh. You should see your kneecap slide up toward your hip or see increased dimpling just above the knee. This motion will   push the back of the knee toward the floor. For __________ seconds, hold the muscle as tight as you can without increasing your pain. Relax the muscles slowly and completely. Repeat __________ times. Complete this exercise __________ times a day. Straight leg raises This exercise stretches the muscles in front of your thigh (quadriceps) and the muscles that move your hips (hip  flexors). Lie on your back with your left / right leg extended and your other knee bent. Tense the muscles in the front of your left / right thigh. You should see your kneecap slide up or see increased dimpling just above the knee. Your thigh may even shake a bit. Keep these muscles tight as you raise your leg 4-6 inches (10-15 cm) off the floor. Do not let your knee bend. Hold this position for __________ seconds. Keep these muscles tense as you lower your leg. Relax your muscles slowly and completely after each repetition. Repeat __________ times. Complete this exercise __________ times a day. Hamstring, isometric Lie on your back on a firm surface. Bend your left / right knee about __________ degrees. Dig your left / right heel into the surface as if you are trying to pull it toward your buttocks. Tighten the muscles in the back of your thighs (hamstring) to "dig" as hard as you can without increasing any pain. Hold this position for __________ seconds. Release the tension gradually and allow your muscles to relax completely for __________ seconds after each repetition. Repeat __________ times. Complete this exercise __________ times a day. Hamstring curls If told by your health care provider, do this exercise while wearing ankle weights. Begin with __________ lb weights. Then increase the weight by 1 lb (0.5 kg) increments. Do not wear ankle weights that are more than __________ lb. Lie on your abdomen with your legs straight. Bend your left / right knee as far as you can without feeling pain. Keep your hips flat against the floor. Hold this position for __________ seconds. Slowly lower your leg to the starting position. Repeat __________ times. Complete this exercise __________ times a day. Squats This exercise strengthens the muscles in front of your thigh and knee (quadriceps). Stand in front of a table, with your feet and knees pointing straight ahead. You may rest your hands on the  table for balance but not for support. Slowly bend your knees and lower your hips like you are going to sit in a chair. Keep your weight over your heels, not over your toes. Keep your lower legs upright so they are parallel with the table legs. Do not let your hips go lower than your knees. Do not bend lower than told by your health care provider. If your knee pain increases, do not bend as low. Hold the squat position for __________ seconds. Slowly push with your legs to return to standing. Do not use your hands to pull yourself to standing. Repeat __________ times. Complete this exercise __________ times a day. Wall slides This exercise strengthens the muscles in front of your thigh and knee (quadriceps). Lean your back against a smooth wall or door, and walk your feet out 18-24 inches (46-61 cm) from it. Place your feet hip-width apart. Slowly slide down the wall or door until your knees bend __________ degrees. Keep your knees over your heels, not over your toes. Keep your knees in line with your hips. Hold this position for __________ seconds. Repeat __________ times. Complete this exercise __________ times a day. Straight leg raises This exercise   strengthens the muscles that rotate the leg at the hip and move it away from your body (hip abductors). Lie on your side with your left / right leg in the top position. Lie so your head, shoulder, knee, and hip line up. You may bend your bottom knee to help you keep your balance. Roll your hips slightly forward so your hips are stacked directly over each other and your left / right knee is facing forward. Leading with your heel, lift your top leg 4-6 inches (10-15 cm). You should feel the muscles in your outer hip lifting. Do not let your foot drift forward. Do not let your knee roll toward the ceiling. Hold this position for __________ seconds. Slowly return your leg to the starting position. Let your muscles relax completely after each  repetition. Repeat __________ times. Complete this exercise __________ times a day. Straight leg raises This exercise stretches the muscles that move your hips away from the front of the pelvis (hip extensors). Lie on your abdomen on a firm surface. You can put a pillow under your hips if that is more comfortable. Tense the muscles in your buttocks and lift your left / right leg about 4-6 inches (10-15 cm). Keep your knee straight as you lift your leg. Hold this position for __________ seconds. Slowly lower your leg to the starting position. Let your leg relax completely after each repetition. Repeat __________ times. Complete this exercise __________ times a day. This information is not intended to replace advice given to you by your health care provider. Make sure you discuss any questions you have with your healthcare provider. Document Revised: 04/23/2018 Document Reviewed: 04/23/2018 [employer] Patient Education  2022 [employer] Hand Exercises Hand exercises can be helpful for almost anyone. These exercises can strengthen the hands, improve flexibility and movement, and increase blood flow to the hands. These results can make work and daily tasks easier. Hand exercises can be especially helpful for people who have joint pain from arthritis or have nerve damage from overuse (carpal tunnel syndrome). These exercises can also help people who have injured a hand. Exercises Most of these hand exercises are gentle stretching and motion exercises. It is usually safe to do them often throughout the day. Warming up your hands before exercise may help to reduce stiffness. You can do this with gentle massage orby placing your hands in warm water for 10-15 minutes. It is normal to feel some stretching, pulling, tightness, or mild discomfort as you begin new exercises. This will gradually improve. Stop an exercise right away if you feel sudden, severe pain or your pain gets worse. Ask your healthcare  provider which exercises are best for you. Knuckle bend or "claw" fist Stand or sit with your arm, hand, and all five fingers pointed straight up. Make sure to keep your wrist straight during the exercise. Gently bend your fingers down toward your palm until the tips of your fingers are touching the top of your palm. Keep your big knuckle straight and just bend the small knuckles in your fingers. Hold this position for __________ seconds. Straighten (extend) your fingers back to the starting position. Repeat this exercise 5-10 times with each hand. Full finger fist Stand or sit with your arm, hand, and all five fingers pointed straight up. Make sure to keep your wrist straight during the exercise. Gently bend your fingers into your palm until the tips of your fingers are touching the middle of your palm. Hold this position for __________ seconds.   Extend your fingers back to the starting position, stretching every joint fully. Repeat this exercise 5-10 times with each hand. Straight fist Stand or sit with your arm, hand, and all five fingers pointed straight up. Make sure to keep your wrist straight during the exercise. Gently bend your fingers at the big knuckle, where your fingers meet your hand, and the middle knuckle. Keep the knuckle at the tips of your fingers straight and try to touch the bottom of your palm. Hold this position for __________ seconds. Extend your fingers back to the starting position, stretching every joint fully. Repeat this exercise 5-10 times with each hand. Tabletop Stand or sit with your arm, hand, and all five fingers pointed straight up. Make sure to keep your wrist straight during the exercise. Gently bend your fingers at the big knuckle, where your fingers meet your hand, as far down as you can while keeping the small knuckles in your fingers straight. Think of forming a tabletop with your fingers. Hold this position for __________ seconds. Extend your fingers  back to the starting position, stretching every joint fully. Repeat this exercise 5-10 times with each hand. Finger spread Place your hand flat on a table with your palm facing down. Make sure your wrist stays straight as you do this exercise. Spread your fingers and thumb apart from each other as far as you can until you feel a gentle stretch. Hold this position for __________ seconds. Bring your fingers and thumb tight together again. Hold this position for __________ seconds. Repeat this exercise 5-10 times with each hand. Making circles Stand or sit with your arm, hand, and all five fingers pointed straight up. Make sure to keep your wrist straight during the exercise. Make a circle by touching the tip of your thumb to the tip of your index finger. Hold for __________ seconds. Then open your hand wide. Repeat this motion with your thumb and each finger on your hand. Repeat this exercise 5-10 times with each hand. Thumb motion Sit with your forearm resting on a table and your wrist straight. Your thumb should be facing up toward the ceiling. Keep your fingers relaxed as you move your thumb. Lift your thumb up as high as you can toward the ceiling. Hold for __________ seconds. Bend your thumb across your palm as far as you can, reaching the tip of your thumb for the small finger (pinkie) side of your palm. Hold for __________ seconds. Repeat this exercise 5-10 times with each hand. Grip strengthening  Hold a stress ball or other soft ball in the middle of your hand. Slowly increase the pressure, squeezing the ball as much as you can without causing pain. Think of bringing the tips of your fingers into the middle of your palm. All of your finger joints should bend when doing this exercise. Hold your squeeze for __________ seconds, then relax. Repeat this exercise 5-10 times with each hand. Contact a health care provider if: Your hand pain or discomfort gets much worse when you do an  exercise. Your hand pain or discomfort does not improve within 2 hours after you exercise. If you have any of these problems, stop doing these exercises right away. Do not do them again unless your health care provider says that you can. Get help right away if: You develop sudden, severe hand pain or swelling. If this happens, stop doing these exercises right away. Do not do them again unless your health care provider says that you can. This   information is not intended to replace advice given to you by your health care provider. Make sure you discuss any questions you have with your healthcare provider. Document Revised: 10/24/2018 Document Reviewed: 07/04/2018 [employer] Patient Education  2022 [employer]  

## 2021-03-17 ENCOUNTER — Ambulatory Visit: Payer: Self-pay | Admitting: Adult Health

## 2021-03-17 NOTE — Progress Notes (Deleted)
3 MONTH FOLLOW UP  Assessment and Plan:   Recurrent major depressive disorder, in partial remission (HCC) -  stress management techniques discussed, increase water, good sleep hygiene discussed, increase exercise, and increase veggies.  -     TSH  Tobacco use disorder Smoking cessation-  instruction/counseling given, counseled patient on the dangers of tobacco use, advised patient to stop smoking, and reviewed strategies to maximize success, patient not ready to quit at this time but slowly tapering  Gastroesophageal reflux disease without esophagitis Continue PPI/H2 blocker, diet discussed  Uncomplicated asthma, unspecified asthma severity, unspecified whether persistent Avoid triggers, controlled at this time  Attention deficit disorder, unspecified hyperactivity presence -  Continue ADD medication, helps with focus, no AE's. The patient was counseled on the addictive nature of the medication and was encouraged to take drug holidays when not needed.  PDMP monitored -   Medication management -     Magnesium  Hyperlipidemia, unspecified hyperlipidemia type -continue medications, check lipids, decrease fatty foods, increase activity.  -     Lipid panel -     TSH  Vitamin D deficiency Continue supplement  -     VITAMIN D 25 Hydroxy (Vit-D Deficiency, Fractures)  Positive ANA Did see rheumatology, workup was negative for autoimmune etiology ***  Vapes nicotine containing substance Smoking cessation discussed; tapering; declines meds     No orders of the defined types were placed in this encounter.   Discussed med's effects and SE's. Screening labs and tests as requested with regular follow-up as recommended. Over 30 minutes of exam, counseling, chart review, and complex, high level critical decision making was performed this visit.   Future Appointments  Date Time Provider Department Center  03/17/2021  3:30 PM Judd Gaudier, NP [facility] None  12/22/2021  9:00 AM  Judd Gaudier, NP [facility] None  03/13/2022  8:00 AM Pollyann Savoy, MD [facility] None     HPI  50 y.o. 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 She is divorced, 2 kids. Lots of stress, gets taken to court every year. ***  She was seen 12/05/19 for polyarthralgia, had weak positive ANA, did see Dr. Corliss Skains 03/14/2021 for work up, all other antibody tests were negative, felt not suggestive of autoimmune diagnosis, felt OA and fibromyalgia likely and advised lifestyle changes. ***  She has had stress fighting with her ex, depression in remission. *** cymbalta *** She has a diagnosis of anxiety and is currently on PRN xanax, reports symptoms are well controlled on current regimen. She currently uses rarely, typically when in court, etc.  Patient is on an ADD medication, she states that the medication is helping and she denies any adverse reactions. She currently takes 1-2 tabs on days that she works, Mon-Fri. She was noting missing pills, caught neighbor breaking into home and taking pills on cameras in home, police are handling. ***  she currently vapes daily; discussed risks associated with smoking, patient is ready to quit. She has cut down on mg of nicotine and trying to stop, reducing dose every week.   She has GERD well controlled recently, takes omperazole PRN, only needing once a week or so.   BMI is There is no height or weight on file to calculate BMI., she has been working on diet and exercise. Walks at least 10000 steps daily.  Wt Readings from Last 3 Encounters:  03/14/21 168 lb 12.8 oz (76.6  kg)  02/15/21 169 lb (76.7 kg)  12/22/20 166 lb 9.6 oz (75.6 kg)   She does not check BPs at home, today their BP is   She does not workout. She denies chest pain, shortness of breath, dizziness. She does endorse intermittent palpitations.   she has a diagnosis of GERD which is currently  managed by pantoprazole 40 mg every 2-3 days.  she reports symptoms is currently well controlled, and denies breakthrough reflux, burning in chest, hoarseness or cough.    She is not on cholesterol medication and denies myalgias. Her cholesterol is at goal. The cholesterol last visit was:   Lab Results  Component Value Date   CHOL 261 (H) 12/22/2020   HDL 127 12/22/2020   LDLCALC 120 (H) 12/22/2020   TRIG 58 12/22/2020   CHOLHDL 2.1 12/22/2020    Last A1C in the office was:  Lab Results  Component Value Date   HGBA1C 5.0 12/22/2020   Patient is currently on 5000 IU Vitamin D supplement.   Lab Results  Component Value Date   VD25OH 53 12/22/2020      Lab Results  Component Value Date   ALT 24 12/22/2020   AST 59 (H) 02/15/2021   ALKPHOS 55 01/24/2017   BILITOT 0.3 12/22/2020     Current Medications:  Current Outpatient Medications on File Prior to Visit  Medication Sig Dispense Refill   ALPRAZolam (XANAX) 0.5 MG tablet Take 1 tablet (0.5 mg total) by mouth 2 (two) times daily as needed. Limit to <5 days per week to avoid addiction/tolerance. 60 tablet 0   amphetamine-dextroamphetamine (ADDERALL) 20 MG tablet Take 1/2-1 tablet 1-2 x /day ONLY as needed for ADD, avoid taking daily to avoid tolerance and addiction. Script should last longer than 30 days. 60 tablet 0   cetirizine (ZYRTEC) 10 MG tablet Take 10 mg by mouth daily.     CHOLECALCIFEROL PO Take 10,000 Units by mouth daily.     famotidine (PEPCID) 20 MG tablet Take 1 tab 30 min prior to breakfast and evening meal as needed for reflux. 60 tablet 1   Loratadine-Pseudoephedrine (CLARITIN-D 24 HOUR PO) Take by mouth daily. (Patient not taking: No sig reported)     Multiple Vitamin (MULTIVITAMIN PO) Take by mouth daily.     OMEPRAZOLE PO Take by mouth daily.     triamcinolone cream (KENALOG) 0.5 % Apply 1 application topically 2 (two) times daily. (Patient taking differently: Apply 1 application topically as needed.) 80 g 2    No current facility-administered medications on file prior to visit.   Allergies:  Allergies  Allergen Reactions   Codeine Nausea And Vomiting   Penicillins Nausea And Vomiting   Medical History:  She has Major depression in remission [facility]); GERD (gastroesophageal reflux disease); ADD (attention deficit disorder); Asthma; Tobacco use disorder; Hyperlipidemia; Overweight (BMI 25.0-29.9); Vitamin D deficiency; Vapes nicotine containing substance; and Elevated BP without diagnosis of hypertension on their problem list. Health Maintenance:   Immunization History  Administered Date(s) Administered   PFIZER(Purple Top)SARS-COV-2 Vaccination 02/22/2020, 03/13/2020   Tdap 02/07/2017   Tetanus: 2018 Pneumovax: - discuss next visit, declines today Prevnar 13:  N/A Flu vaccine: N/A Shingrix: discucss age 40  No LMP recorded. Pap: 03/2018 normal neg HPV, due 3-5 years, plans to schedule with GYN later this year MGM: 11/2015 , reminded to schedule - planning on getting established with GYN, but will order to schedule at breast center due to family history  DEXA: n/a  CXR: 2018  Colonoscopy: DUE , - cologuard EGD:  Vision: wears glasses, sees annually Dental: Dr. Marland Kitchen 2022, goes q58m  Patient Care Team: Lucky Cowboy, MD as PCP - General (Internal Medicine)  Surgical History:  She has a past surgical history that includes External ear surgery; Inner ear surgery; Middle ear surgery; Bladder surgery; Cesarean section; mastoid surgery; Adenoidectomy (Bilateral); and Breast cyst excision (1994). Family History:  Herfamily history includes ADD / ADHD in her daughter and son; Brain cancer in her maternal grandmother; Breast cancer in her maternal grandmother; Cancer in her mother; Depression in her son; Diabetes in her father; Hearing loss in her daughter; Heart attack (age of onset: 72) in her paternal grandfather; Hypertension in her father; Learning disabilities in her daughter and son;  Lung cancer in her maternal grandfather; Lung cancer (age of onset: 10) in her maternal uncle; Rheum arthritis in her paternal grandmother. Social History:  She reports that she has quit smoking. Her smoking use included cigarettes. She has a 10.00 pack-year smoking history. She has never used smokeless tobacco. She reports current alcohol use. She reports current drug use. Drug: Marijuana.  Review of Systems: Review of Systems  Constitutional: Negative.  Negative for malaise/fatigue and weight loss.  HENT:  Negative for hearing loss (chronic/unchanged) and tinnitus.   Eyes: Negative.  Negative for blurred vision and double vision.  Respiratory: Negative.  Negative for cough, sputum production, shortness of breath and wheezing.   Cardiovascular: Negative.  Negative for chest pain, palpitations, orthopnea, claudication, leg swelling and PND.  Gastrointestinal:  Negative for abdominal pain, blood in stool, constipation, diarrhea, heartburn, melena, nausea and vomiting.  Genitourinary: Negative.   Musculoskeletal: Negative.  Negative for falls, joint pain and myalgias.  Skin:  Positive for rash (intermittent).  Neurological: Negative.  Negative for dizziness, tingling, sensory change, weakness and headaches.  Endo/Heme/Allergies:  Negative for polydipsia.  Psychiatric/Behavioral: Negative.  Negative for depression, memory loss, substance abuse and suicidal ideas. The patient is not nervous/anxious and does not have insomnia.   All other systems reviewed and are negative.  Physical Exam: Estimated body mass index is 28.97 kg/m as calculated from the following:   Height as of 03/14/21: 5\' 4"  (1.626 m).   Weight as of 03/14/21: 168 lb 12.8 oz (76.6 kg). There were no vitals taken for this visit. General Appearance: Well nourished, in no apparent distress.  Eyes: PERRLA, EOMs, conjunctiva no swelling or erythema Sinuses: No Frontal/maxillary tenderness  ENT/Mouth: Ext aud canals clear, normal  light reflex with TMs without erythema, bulging. Good dentition. No erythema, swelling, or exudate on post pharynx. Tonsils not swollen or erythematous. Poor hearing, bil hearing aids.  Neck: Supple, thyroid normal. No bruits  Respiratory: Respiratory effort normal, BS equal bilaterally without rales, rhonchi, wheezing or stridor.  Cardio: RRR without murmurs, rubs or gallops. Brisk peripheral pulses without edema.  Chest: symmetric, with normal excursions and percussion.  Breasts: Very dense and irregular throughout bilaterally, without nipple discharge, retractions.  Abdomen: Soft, nontender, no guarding, rebound, hernias, masses, or organomegaly.  Lymphatics: Non tender without lymphadenopathy.  Genitourinary: Defer - she plans to schedule GYN Musculoskeletal: Full ROM all peripheral extremities,5/5 strength, and normal gait.  Skin: Warm, dry without rashes, ecchymosis. She has scabbed lesion to R shoulder.  Neuro: Cranial nerves intact, reflexes equal bilaterally. Normal muscle tone, no cerebellar symptoms. Sensation intact.  Psych: Awake and oriented X 3, normal affect, Insight and Judgment appropriate.   EKG: NSR  03/16/21 Vanessa Lee 12:58 PM [facility]  Internal Medicine

## 2021-04-28 ENCOUNTER — Other Ambulatory Visit: Payer: Self-pay

## 2021-04-28 DIAGNOSIS — F411 Generalized anxiety disorder: Secondary | ICD-10-CM

## 2021-04-29 MED ORDER — AMPHETAMINE-DEXTROAMPHETAMINE 20 MG PO TABS: ORAL_TABLET | ORAL | 0 refills | Status: DC

## 2021-04-29 MED ORDER — ALPRAZOLAM 0.5 MG PO TABS: 0.5000 mg | ORAL_TABLET | Freq: Two times a day (BID) | ORAL | 0 refills | Status: DC | PRN

## 2021-05-23 ENCOUNTER — Ambulatory Visit: Payer: BC Managed Care – PPO | Admitting: Adult Health

## 2021-06-03 ENCOUNTER — Other Ambulatory Visit: Payer: Self-pay | Admitting: Adult Health

## 2021-06-03 ENCOUNTER — Other Ambulatory Visit: Payer: Self-pay | Admitting: Nurse Practitioner

## 2021-06-03 ENCOUNTER — Encounter: Payer: Self-pay | Admitting: Internal Medicine

## 2021-06-03 DIAGNOSIS — F411 Generalized anxiety disorder: Secondary | ICD-10-CM

## 2021-06-03 MED ORDER — AMPHETAMINE-DEXTROAMPHETAMINE 20 MG PO TABS: ORAL_TABLET | ORAL | 0 refills | Status: DC

## 2021-06-03 MED ORDER — ALPRAZOLAM 0.5 MG PO TABS: 0.5000 mg | ORAL_TABLET | Freq: Two times a day (BID) | ORAL | 0 refills | Status: DC | PRN

## 2021-06-23 ENCOUNTER — Other Ambulatory Visit: Payer: Self-pay | Admitting: Adult Health Nurse Practitioner

## 2021-06-23 DIAGNOSIS — B3731 Acute candidiasis of vulva and vagina: Secondary | ICD-10-CM

## 2021-06-24 ENCOUNTER — Other Ambulatory Visit: Payer: Self-pay | Admitting: Nurse Practitioner

## 2021-06-24 DIAGNOSIS — H66005 Acute suppurative otitis media without spontaneous rupture of ear drum, recurrent, left ear: Secondary | ICD-10-CM | POA: Diagnosis not present

## 2021-06-24 MED ORDER — AMPHETAMINE-DEXTROAMPHETAMINE 20 MG PO TABS: ORAL_TABLET | ORAL | 0 refills | Status: DC

## 2021-06-30 ENCOUNTER — Other Ambulatory Visit: Payer: Self-pay | Admitting: Nurse Practitioner

## 2021-06-30 MED ORDER — AMPHETAMINE-DEXTROAMPHETAMINE 20 MG PO TABS: ORAL_TABLET | ORAL | 0 refills | Status: DC

## 2021-07-13 DIAGNOSIS — H7201 Central perforation of tympanic membrane, right ear: Secondary | ICD-10-CM | POA: Diagnosis not present

## 2021-07-13 DIAGNOSIS — M2669 Other specified disorders of temporomandibular joint: Secondary | ICD-10-CM | POA: Diagnosis not present

## 2021-07-13 DIAGNOSIS — H9201 Otalgia, right ear: Secondary | ICD-10-CM | POA: Diagnosis not present

## 2021-07-13 DIAGNOSIS — H608X3 Other otitis externa, bilateral: Secondary | ICD-10-CM | POA: Diagnosis not present

## 2021-07-31 ENCOUNTER — Other Ambulatory Visit: Payer: Self-pay | Admitting: Adult Health

## 2021-07-31 DIAGNOSIS — F411 Generalized anxiety disorder: Secondary | ICD-10-CM

## 2021-08-01 MED ORDER — ALPRAZOLAM 0.5 MG PO TABS: 0.5000 mg | ORAL_TABLET | Freq: Two times a day (BID) | ORAL | 0 refills | Status: DC | PRN

## 2021-08-01 MED ORDER — AMPHETAMINE-DEXTROAMPHETAMINE 20 MG PO TABS: ORAL_TABLET | ORAL | 0 refills | Status: DC

## 2021-08-08 ENCOUNTER — Encounter: Payer: Self-pay | Admitting: Adult Health

## 2021-08-22 ENCOUNTER — Other Ambulatory Visit: Payer: Self-pay | Admitting: Internal Medicine

## 2021-08-31 ENCOUNTER — Other Ambulatory Visit: Payer: Self-pay | Admitting: Nurse Practitioner

## 2021-08-31 MED ORDER — AMPHETAMINE-DEXTROAMPHETAMINE 20 MG PO TABS: ORAL_TABLET | ORAL | 0 refills | Status: DC

## 2021-09-13 ENCOUNTER — Other Ambulatory Visit: Payer: Self-pay | Admitting: Nurse Practitioner

## 2021-09-13 DIAGNOSIS — F411 Generalized anxiety disorder: Secondary | ICD-10-CM

## 2021-09-14 MED ORDER — ALPRAZOLAM 0.5 MG PO TABS: 0.5000 mg | ORAL_TABLET | Freq: Two times a day (BID) | ORAL | 0 refills | Status: DC | PRN

## 2021-09-29 ENCOUNTER — Other Ambulatory Visit: Payer: Self-pay | Admitting: Nurse Practitioner

## 2021-09-30 MED ORDER — AMPHETAMINE-DEXTROAMPHETAMINE 20 MG PO TABS: ORAL_TABLET | ORAL | 0 refills | Status: DC

## 2021-10-15 ENCOUNTER — Other Ambulatory Visit: Payer: Self-pay | Admitting: Adult Health

## 2021-10-15 DIAGNOSIS — F411 Generalized anxiety disorder: Secondary | ICD-10-CM

## 2021-10-17 MED ORDER — ALPRAZOLAM 0.5 MG PO TABS: 0.5000 mg | ORAL_TABLET | Freq: Two times a day (BID) | ORAL | 0 refills | Status: DC | PRN

## 2021-11-01 ENCOUNTER — Other Ambulatory Visit: Payer: Self-pay | Admitting: Nurse Practitioner

## 2021-11-02 ENCOUNTER — Other Ambulatory Visit: Payer: Self-pay | Admitting: Nurse Practitioner

## 2021-11-02 MED ORDER — AMPHETAMINE-DEXTROAMPHETAMINE 20 MG PO TABS: ORAL_TABLET | ORAL | 0 refills | Status: DC

## 2021-11-03 ENCOUNTER — Other Ambulatory Visit: Payer: Self-pay | Admitting: Nurse Practitioner

## 2021-11-03 DIAGNOSIS — F411 Generalized anxiety disorder: Secondary | ICD-10-CM

## 2021-11-28 ENCOUNTER — Other Ambulatory Visit: Payer: Self-pay | Admitting: Nurse Practitioner

## 2021-11-28 DIAGNOSIS — F411 Generalized anxiety disorder: Secondary | ICD-10-CM

## 2021-11-28 MED ORDER — ALPRAZOLAM 0.5 MG PO TABS: 0.5000 mg | ORAL_TABLET | Freq: Two times a day (BID) | ORAL | 0 refills | Status: DC | PRN

## 2021-12-02 ENCOUNTER — Other Ambulatory Visit: Payer: Self-pay | Admitting: Nurse Practitioner

## 2021-12-02 MED ORDER — AMPHETAMINE-DEXTROAMPHETAMINE 20 MG PO TABS: ORAL_TABLET | ORAL | 0 refills | Status: DC

## 2021-12-22 ENCOUNTER — Encounter: Payer: Self-pay | Admitting: Adult Health

## 2021-12-22 ENCOUNTER — Ambulatory Visit: Payer: 59 | Admitting: Adult Health

## 2021-12-22 VITALS — BP 142/100 | HR 81 | Temp 96.8°F | Ht 64.0 in | Wt 179.4 lb

## 2021-12-22 DIAGNOSIS — E669 Obesity, unspecified: Secondary | ICD-10-CM

## 2021-12-22 DIAGNOSIS — F17200 Nicotine dependence, unspecified, uncomplicated: Secondary | ICD-10-CM

## 2021-12-22 DIAGNOSIS — Z1231 Encounter for screening mammogram for malignant neoplasm of breast: Secondary | ICD-10-CM

## 2021-12-22 DIAGNOSIS — F419 Anxiety disorder, unspecified: Secondary | ICD-10-CM

## 2021-12-22 DIAGNOSIS — D519 Vitamin B12 deficiency anemia, unspecified: Secondary | ICD-10-CM

## 2021-12-22 DIAGNOSIS — Z720 Tobacco use: Secondary | ICD-10-CM

## 2021-12-22 DIAGNOSIS — E559 Vitamin D deficiency, unspecified: Secondary | ICD-10-CM

## 2021-12-22 DIAGNOSIS — E782 Mixed hyperlipidemia: Secondary | ICD-10-CM

## 2021-12-22 DIAGNOSIS — I10 Essential (primary) hypertension: Secondary | ICD-10-CM | POA: Diagnosis not present

## 2021-12-22 DIAGNOSIS — Z0000 Encounter for general adult medical examination without abnormal findings: Secondary | ICD-10-CM | POA: Diagnosis not present

## 2021-12-22 DIAGNOSIS — Z0001 Encounter for general adult medical examination with abnormal findings: Secondary | ICD-10-CM

## 2021-12-22 DIAGNOSIS — Z1389 Encounter for screening for other disorder: Secondary | ICD-10-CM

## 2021-12-22 DIAGNOSIS — E538 Deficiency of other specified B group vitamins: Secondary | ICD-10-CM | POA: Insufficient documentation

## 2021-12-22 DIAGNOSIS — Z131 Encounter for screening for diabetes mellitus: Secondary | ICD-10-CM

## 2021-12-22 DIAGNOSIS — Z1329 Encounter for screening for other suspected endocrine disorder: Secondary | ICD-10-CM

## 2021-12-22 DIAGNOSIS — E66811 Obesity, class 1: Secondary | ICD-10-CM

## 2021-12-22 DIAGNOSIS — R232 Flushing: Secondary | ICD-10-CM

## 2021-12-22 DIAGNOSIS — Z136 Encounter for screening for cardiovascular disorders: Secondary | ICD-10-CM | POA: Diagnosis not present

## 2021-12-22 DIAGNOSIS — F325 Major depressive disorder, single episode, in full remission: Secondary | ICD-10-CM

## 2021-12-22 DIAGNOSIS — J45909 Unspecified asthma, uncomplicated: Secondary | ICD-10-CM

## 2021-12-22 DIAGNOSIS — Z1211 Encounter for screening for malignant neoplasm of colon: Secondary | ICD-10-CM

## 2021-12-22 DIAGNOSIS — F988 Other specified behavioral and emotional disorders with onset usually occurring in childhood and adolescence: Secondary | ICD-10-CM

## 2021-12-22 DIAGNOSIS — Z124 Encounter for screening for malignant neoplasm of cervix: Secondary | ICD-10-CM

## 2021-12-22 DIAGNOSIS — R030 Elevated blood-pressure reading, without diagnosis of hypertension: Secondary | ICD-10-CM

## 2021-12-22 DIAGNOSIS — K219 Gastro-esophageal reflux disease without esophagitis: Secondary | ICD-10-CM

## 2021-12-22 DIAGNOSIS — M797 Fibromyalgia: Secondary | ICD-10-CM

## 2021-12-22 MED ORDER — LOSARTAN POTASSIUM 100 MG PO TABS: ORAL_TABLET | ORAL | 0 refills | Status: DC

## 2021-12-22 MED ORDER — ESCITALOPRAM OXALATE 10 MG PO TABS: ORAL_TABLET | ORAL | 3 refills | Status: DC

## 2021-12-22 NOTE — Patient Instructions (Addendum)
Vanessa Lee , Thank you for taking time to come for your Annual Wellness Visit. I appreciate your ongoing commitment to your health goals. Please review the following plan we discussed and let me know if I can assist you in the future.   These are the goals we discussed:  Goals      Blood Pressure < 130/80     LDL CALC < 100     Quit Smoking        This is a list of the screening recommended for you and due dates:  Health Maintenance  Topic Date Due   Colon Cancer Screening  Never done   Mammogram  01/28/2021   Pap Smear  03/22/2021   COVID-19 Vaccine (3 - Pfizer risk series) 01/07/2022*   Zoster (Shingles) Vaccine (1 of 2) 03/24/2022*   Hepatitis C Screening: USPSTF Recommendation to screen - Ages 18-79 yo.  12/23/2022*   Flu Shot  02/14/2022   Tetanus Vaccine  02/08/2027   HIV Screening  Completed   HPV Vaccine  Aged Out  *Topic was postponed. The date shown is not the original due date.      Please call [facility] to schedule mammogram        HYPERTENSION INFORMATION  Monitor your blood pressure at home, please keep a record and bring that in with you to your next office visit.   Go to the ER if any CP, SOB, nausea, dizziness, severe HA, changes vision/speech  Testing/Procedures: HOW TO TAKE YOUR BLOOD PRESSURE: Rest 5 minutes before taking your blood pressure. Don't smoke or drink caffeinated beverages for at least 30 minutes before. Take your blood pressure before (not after) you eat. Sit comfortably with your back supported and both feet on the floor (don't cross your legs). Elevate your arm to heart level on a table or a desk. Use the proper sized cuff. It should fit smoothly and snugly around your bare upper arm. There should be enough room to slip a fingertip under the cuff. The bottom edge of the cuff should be 1 inch above the crease of the elbow.  Your most recent BP: BP: (!) 149/113   Take your medications faithfully as  instructed. Maintain a healthy weight. Get at least 150 minutes of aerobic exercise per week. Minimize salt intake. Minimize alcohol intake  DASH Eating Plan DASH stands for "Dietary Approaches to Stop Hypertension." The DASH eating plan is a healthy eating plan that has been shown to reduce high blood pressure (hypertension). Additional health benefits may include reducing the risk of type 2 diabetes mellitus, heart disease, and stroke. The DASH eating plan may also help with weight loss. WHAT DO I NEED TO KNOW ABOUT THE DASH EATING PLAN? For the DASH eating plan, you will follow these general guidelines: Choose foods with a percent daily value for sodium of less than 5% (as listed on the food label). Use salt-free seasonings or herbs instead of table salt or sea salt. Check with your health care provider or pharmacist before using salt substitutes. Eat lower-sodium products, often labeled as "lower sodium" or "no salt added." Eat fresh foods. Eat more vegetables, fruits, and low-fat dairy products. Choose whole grains. Look for the word "whole" as the first word in the ingredient list. Choose fish and skinless chicken or [place] more often than red meat. Limit fish, poultry, and meat to 6 oz (170 g) each day. Limit sweets, desserts, sugars, and sugary drinks. Choose heart-healthy fats. Limit cheese to  1 oz (28 g) per day. Eat more home-cooked food and less restaurant, buffet, and fast food. Limit fried foods. Cook foods using methods other than frying. Limit canned vegetables. If you do use them, rinse them well to decrease the sodium. When eating at a restaurant, ask that your food be prepared with less salt, or no salt if possible. WHAT FOODS CAN I EAT? Seek help from a dietitian for individual calorie needs. Grains Whole grain or whole wheat bread. Brown rice. Whole grain or whole wheat pasta. Quinoa, bulgur, and whole grain cereals. Low-sodium cereals. Corn or whole wheat flour  tortillas. Whole grain cornbread. Whole grain crackers. Low-sodium crackers. Vegetables Fresh or frozen vegetables (raw, steamed, roasted, or grilled). Low-sodium or reduced-sodium tomato and vegetable juices. Low-sodium or reduced-sodium tomato sauce and paste. Low-sodium or reduced-sodium canned vegetables.  Fruits All fresh, canned (in natural juice), or frozen fruits. Meat and Other Protein Products Ground beef (85% or leaner), grass-fed beef, or beef trimmed of fat. Skinless chicken or [place]. Ground chicken or [place]. Pork trimmed of fat. All fish and seafood. Eggs. Dried beans, peas, or lentils. Unsalted nuts and seeds. Unsalted canned beans. Dairy Low-fat dairy products, such as skim or 1% milk, 2% or reduced-fat cheeses, low-fat ricotta or cottage cheese, or plain low-fat yogurt. Low-sodium or reduced-sodium cheeses. Fats and Oils Tub margarines without trans fats. Light or reduced-fat mayonnaise and salad dressings (reduced sodium). Avocado. Safflower, olive, or canola oils. Natural peanut or almond butter. Other Unsalted popcorn and pretzels. The items listed above may not be a complete list of recommended foods or beverages. Contact your dietitian for more options. WHAT FOODS ARE NOT RECOMMENDED? Grains White bread. White pasta. White rice. Refined cornbread. Bagels and croissants. Crackers that contain trans fat. Vegetables Creamed or fried vegetables. Vegetables in a cheese sauce. Regular canned vegetables. Regular canned tomato sauce and paste. Regular tomato and vegetable juices. Fruits Dried fruits. Canned fruit in light or heavy syrup. Fruit juice. Meat and Other Protein Products Fatty cuts of meat. Ribs, chicken wings, bacon, sausage, bologna, salami, chitterlings, fatback, hot dogs, bratwurst, and packaged luncheon meats. Salted nuts and seeds. Canned beans with salt. Dairy Whole or 2% milk, cream, half-and-half, and cream cheese. Whole-fat or sweetened yogurt. Full-fat  cheeses or blue cheese. Nondairy creamers and whipped toppings. Processed cheese, cheese spreads, or cheese curds. Condiments Onion and garlic salt, seasoned salt, table salt, and sea salt. Canned and packaged gravies. Worcestershire sauce. Tartar sauce. Barbecue sauce. Teriyaki sauce. Soy sauce, including reduced sodium. Steak sauce. Fish sauce. Oyster sauce. Cocktail sauce. Horseradish. Ketchup and mustard. Meat flavorings and tenderizers. Bouillon cubes. Hot sauce. Tabasco sauce. Marinades. Taco seasonings. Relishes. Fats and Oils Butter, stick margarine, lard, shortening, ghee, and bacon fat. Coconut, palm kernel, or palm oils. Regular salad dressings. Other Pickles and olives. Salted popcorn and pretzels. The items listed above may not be a complete list of foods and beverages to avoid. Contact your dietitian for more information. WHERE CAN I FIND MORE INFORMATION? National Heart, [employer], and [employer] Institute: travelstabloid.com Document Released: 06/22/2011 Document Revised: 11/17/2013 Document Reviewed: 05/07/2013 [facility] Patient Information 2015 [place], [place]. This information is not intended to replace advice given to you by your health care provider. Make sure you discuss any questions you have with your health care provider.      Escitalopram Tablets What is this medication? ESCITALOPRAM (es sye TAL oh pram) treats depression and anxiety. It increases the amount of serotonin in the brain, a hormone that helps  regulate mood. It belongs to a group of medications called SSRIs. This medicine may be used for other purposes; ask your health care provider or pharmacist if you have questions. COMMON BRAND NAME(S): Lexapro What should I tell my care team before I take this medication? They need to know if you have any of these conditions: Bipolar disorder or a family history of bipolar disorder Diabetes Glaucoma Heart disease Kidney or liver  disease Receiving electroconvulsive therapy Seizures Suicidal thoughts, plans, or attempt by you or a family member An unusual or allergic reaction to escitalopram, the related medication citalopram, other medications, foods, dyes, or preservatives Pregnant or trying to become pregnant Breast-feeding How should I use this medication? Take this medication by mouth with a glass of water. Follow the directions on the prescription label. You can take it with or without food. If it upsets your stomach, take it with food. Take your medication at regular intervals. Do not take it more often than directed. Do not stop taking this medication suddenly except upon the advice of your care team. Stopping this medication too quickly may cause serious side effects or your condition may worsen. A special MedGuide will be given to you by the pharmacist with each prescription and refill. Be sure to read this information carefully each time. Talk to your care team regarding the use of this medication in children. Special care may be needed. Overdosage: If you think you have taken too much of this medicine contact a poison control center or emergency room at once. NOTE: This medicine is only for you. Do not share this medicine with others. What if I miss a dose? If you miss a dose, take it as soon as you can. If it is almost time for your next dose, take only that dose. Do not take double or extra doses. What may interact with this medication? Do not take this medication with any of the following: Certain medications for fungal infections like fluconazole, itraconazole, ketoconazole, posaconazole, voriconazole Cisapride Citalopram Dronedarone Linezolid MAOIs like Carbex, Eldepryl, Marplan, Nardil, and Parnate Methylene blue (injected into a vein) Pimozide Thioridazine This medication may also interact with the following: Alcohol Amphetamines Aspirin and aspirin-like medications Carbamazepine Certain  medications for depression, anxiety, or psychotic disturbances Certain medications for migraine headache like almotriptan, eletriptan, frovatriptan, naratriptan, rizatriptan, sumatriptan, zolmitriptan Certain medications for sleep Certain medications that treat or prevent blood clots like warfarin, enoxaparin, dalteparin Cimetidine Diuretics Dofetilide Fentanyl Furazolidone Isoniazid Lithium Metoprolol NSAIDs, medications for pain and inflammation, like ibuprofen or naproxen Other medications that prolong the QT interval (cause an abnormal heart rhythm) Procarbazine Rasagiline Supplements like St. John's wort, kava kava, valerian Tramadol Tryptophan Ziprasidone This list may not describe all possible interactions. Give your health care provider a list of all the medicines, herbs, non-prescription drugs, or dietary supplements you use. Also tell them if you smoke, drink alcohol, or use illegal drugs. Some items may interact with your medicine. What should I watch for while using this medication? Tell your care team if your symptoms do not get better or if they get worse. Visit your care team for regular checks on your progress. Because it may take several weeks to see the full effects of this medication, it is important to continue your treatment as prescribed by your care team. Watch for new or worsening thoughts of suicide or depression. This includes sudden changes in mood, behaviors, or thoughts. These changes can happen at any time but are more common in the beginning of  treatment or after a change in dose. Call your care team right away if you experience these thoughts or worsening depression. Manic episodes may happen in patients with bipolar disorder who take this medication. Watch for changes in feelings or behaviors such as feeling anxious, nervous, agitated, panicky, irritable, hostile, aggressive, impulsive, severely restless, overly excited and hyperactive, or trouble sleeping.  These symptoms can happen at any time but are more common in the beginning of treatment or after a change in dose. Call your care team right away if you notice any of these symptoms. You may get drowsy or dizzy. Do not drive, use machinery, or do anything that needs mental alertness until you know how this medication affects you. Do not stand or sit up quickly, especially if you are an older patient. This reduces the risk of dizzy or fainting spells. Alcohol may interfere with the effect of this medication. Avoid alcoholic drinks. Your mouth may get dry. Chewing sugarless gum or sucking hard candy, and drinking plenty of water may help. Contact your care team if the problem does not go away or is severe. What side effects may I notice from receiving this medication? Side effects that you should report to your care team as soon as possible: Allergic reactions--skin rash, itching, hives, swelling of the face, lips, tongue, or throat Bleeding--bloody or black, tar-like stools, red or dark brown urine, vomiting blood or brown material that looks like coffee grounds, small, red or purple spots on skin, unusual bleeding or bruising Heart rhythm changes--fast or irregular heartbeat, dizziness, feeling faint or lightheaded, chest pain, trouble breathing Low sodium level--muscle weakness, fatigue, dizziness, headache, confusion Serotonin syndrome--irritability, confusion, fast or irregular heartbeat, muscle stiffness, twitching muscles, sweating, high fever, seizure, chills, vomiting, diarrhea Sudden eye pain or change in vision such as blurry vision, seeing halos around lights, vision loss Thoughts of suicide or self-harm, worsening mood, feelings of depression Side effects that usually do not require medical attention (report to your care team if they continue or are bothersome): Change in sex drive or performance Diarrhea Excessive sweating Nausea Tremors or shaking Upset stomach This list may not  describe all possible side effects. Call your doctor for medical advice about side effects. You may report side effects to FDA at 1-800-FDA-1088. Where should I keep my medication? Keep out of reach of children and pets. Store at room temperature between 15 and 30 degrees C (59 and 86 degrees F). Throw away any unused medication after the expiration date. NOTE: This sheet is a summary. It may not cover all possible information. If you have questions about this medicine, talk to your doctor, pharmacist, or health care provider.  2023 [employer] Standard (2020-06-21 00:00:00)

## 2021-12-22 NOTE — Progress Notes (Signed)
Complete Physical  Assessment and Plan:  Encounter for Annual Physical Exam with abnormal findings Due annually  Health Maintenance reviewed Healthy lifestyle reviewed and goals set - reminded to schedule mammogram - order placed - discussed GYN - she requests we take over PAPs, will schedule in 3 months with NP here  Hypertension Initiate medication: losartan 100 mg - start with 1/2 tab then increase to whole tab in 2 weeks Monitor blood pressure at home; call if consistently over 130/80 Discussed DASH diet Advised to go to the ER if any CP, SOB, nausea, dizziness, severe HA, changes vision/speech, left arm numbness and tingling and jaw pain. Follow up in 2-3 months -     EKG 12-Lead  Recurrent major depressive disorder, in partial remission (HCC) - starting lexapro 10 mg, 2-3 month follow up -  stress management techniques discussed, increase water, good sleep hygiene discussed, increase exercise, and increase veggies.  -     TSH  Tobacco use disorder Smoking cessation-  instruction/counseling given, counseled patient on the dangers of tobacco use, advised patient to stop smoking, and reviewed strategies to maximize success, patient not ready to quit at this time but slowly tapering  Gastroesophageal reflux disease without esophagitis Continue PPI/H2 blocker, diet discussed  Uncomplicated asthma, unspecified asthma severity, unspecified whether persistent Avoid triggers, controlled at this time  Attention deficit disorder, unspecified hyperactivity presence -  Continue ADD medication, helps with focus, no AE's. The patient was counseled on the addictive nature of the medication and was encouraged to take drug holidays when not needed.  PDMP monitored -   Medication management -     Magnesium  Hyperlipidemia, unspecified hyperlipidemia type -continue medications, check lipids, decrease fatty foods, increase activity.  -     Lipid panel -     TSH  Vitamin D deficiency -      VITAMIN D 25 Hydroxy (Vit-D Deficiency, Fractures)  Positive ANA Otherwise negative autoimmune workup by Dr. Corliss Skains She follows annually and as needed for arthritic conditions  Vapes nicotine containing substance Smoking cessation discussed; tapering; declines meds  Encounter for screening mammogram for malignant neoplasm of breast Ordered to schedule at [facility] -  -     MM Digital Screening; Future  Anxiety/hot flashes Start new medication as prescribed - lexapro 10 mg daily Stress management techniques discussed, increase water, good sleep hygiene discussed, increase exercise, and increase veggies.  Follow up 2-3 month, call the office if any new AE's from medications and we will switch them  Fibromyalgia Dr. Corliss Skains following Start new medication as prescribed - lexapro Stress management techniques discussed, increase water, good sleep hygiene discussed, increase exercise, and increase veggies.  Follow up 2-3 month, call the office if any new AE's from medications and we will switch them (consider cymbalta)  Screening colon cancer - GI referral placed after discussion   Obesity Long discussion about weight loss, diet, and exercise Recommended diet heavy in fruits and veggies and low in animal meats, cheeses, and dairy products, appropriate calorie intake Patient will work on portions, reduce alcohol Discussed appropriate weight for height  Follow up at next visit  Orders Placed This Encounter  Procedures   MM Digital Screening   CBC with Differential/Platelet   COMPLETE METABOLIC PANEL WITH GFR   Magnesium   Lipid panel   TSH   Hemoglobin A1c   VITAMIN D 25 Hydroxy (Vit-D Deficiency, Fractures)   Vitamin B12   Microalbumin / creatinine urine ratio   Urinalysis, Routine w  reflex microscopic   Ambulatory referral to Gastroenterology   EKG 12-Lead    Discussed med's effects and SE's. Screening labs and tests as requested with regular follow-up as  recommended. Over 40 minutes of exam, counseling, chart review, and complex, high level critical decision making was performed this visit.   Future Appointments  Date Time Provider Department Center  03/13/2022  8:00 AM Pollyann Savoy, MD [facility] None  03/28/2022  9:30 AM Judd Gaudier, NP [facility] None  12/25/2022  9:00 AM Adela Glimpse, NP [facility] None     HPI  51 y.o. female  presents for a complete physical and follow up for has Major depression in remission [facility]); GERD (gastroesophageal reflux disease); ADD (attention deficit disorder); Asthma; Tobacco use disorder; Hyperlipidemia; Obesity (BMI 30.0-34.9); Vitamin D deficiency; Vapes nicotine containing substance; Hypertension; Anemia due to vitamin B12 deficiency; Fibromyalgia; and Hot flashes on their problem list.    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  She is divorced, 2 kids. Lots of stress, gets taken to court every year by her ex.   She is still having cycles but heavy and irregular, never scheduled with GYN, PAP overdue, some concerns with labile mood, hot flashes.   She was seen 12/05/19 for polyarthralgia, rash, nail changes, mouth sores- she was checked sed rate, AntiDNA, RF which were negative. She had a positive ANA but with low titer 1:80. She was evaluated by Dr. Corliss Skains with otherwise unremarkable autoimmune workup, felt OA/condromalacia, also fibromyalgia.   She has had stress fighting with her ex, depression in remission, but reports recently with perimenopause more labile mood, anxiety. Currently on PRN xanax, interested in daily agent.   Patient is on an ADD medication, she states that the medication is helping and she denies any adverse reactions. She currently takes 1-2 tabs on days that she works, Mon-Fri.   she currently vapes daily, CBD/marijuana product, helps more than anything with chronic pain; discussed risks associated with smoking, receptive to trying other formulations,  goal to quit all smoking.   She has GERD well controlled recently, takes omperazole PRN, only needing once a week or so.   BMI is Body mass index is 30.79 kg/m., she has been working on diet and exercise, does have 1 drink nightly, receptive to reduce/quit, would like to lose some weight.  Wt Readings from Last 3 Encounters:  12/22/21 179 lb 6.4 oz (81.4 kg)  03/14/21 168 lb 12.8 oz (76.6 kg)  02/15/21 169 lb (76.7 kg)   She does not check BPs at home, today their BP is  BP Readings from Last 3 Encounters:  12/22/21 (!) 142/100  03/14/21 131/89  02/15/21 (!) 139/93  She does not workout. She denies chest pain, shortness of breath, dizziness. She does endorse intermittent palpitations.   She is not on cholesterol medication and denies myalgias. Her cholesterol is not at goal. The cholesterol last visit was:   Lab Results  Component Value Date   CHOL 261 (H) 12/22/2020   HDL 127 12/22/2020   LDLCALC 120 (H) 12/22/2020   TRIG 58 12/22/2020   CHOLHDL 2.1 12/22/2020    Last A1C in the office was:  Lab Results  Component Value Date   HGBA1C 5.0 12/22/2020   Patient is currently on 5000 IU Vitamin D supplement.   Lab Results  Component Value Date   VD25OH 53 12/22/2020      Lab Results  Component Value Date   VITAMINB12 334 11/22/2015      Current  Medications:  Current Outpatient Medications on File Prior to Visit  Medication Sig Dispense Refill   ALPRAZolam (XANAX) 0.5 MG tablet Take 1 tablet (0.5 mg total) by mouth 2 (two) times daily as needed (severe anxiety or panic attacks). Limit to <5 days per week to avoid addiction/tolerance. 45 tablet 0   amphetamine-dextroamphetamine (ADDERALL) 20 MG tablet Take 1/2-1 tablet 1-2 x /day ONLY as needed for ADD, avoid taking daily to avoid tolerance and addiction. Script should last longer than 30 days. 60 tablet 0   cetirizine (ZYRTEC) 10 MG tablet Take 10 mg by mouth daily.     CHOLECALCIFEROL PO Take 10,000 Units by mouth  daily.     famotidine (PEPCID) 20 MG tablet TAKE 1 TABLET BY MOUTH TWICE DAILY WITH MEALS TO PREVENT HEARTBURN AND INDIGESTION 180 tablet 3   Loratadine-Pseudoephedrine (CLARITIN-D 24 HOUR PO) Take by mouth daily.     Multiple Vitamin (MULTIVITAMIN PO) Take by mouth daily.     OMEPRAZOLE PO Take by mouth daily.     triamcinolone cream (KENALOG) 0.5 % Apply 1 application topically 2 (two) times daily. (Patient taking differently: Apply 1 application. topically as needed.) 80 g 2   No current facility-administered medications on file prior to visit.   Allergies:  Allergies  Allergen Reactions   Codeine Nausea And Vomiting   Penicillins Nausea And Vomiting   Medical History:  She has Major depression in remission [facility](HCC); GERD (gastroesophageal reflux disease); ADD (attention deficit disorder); Asthma; Tobacco use disorder; Hyperlipidemia; Obesity (BMI 30.0-34.9); Vitamin D deficiency; Vapes nicotine containing substance; Hypertension; Anemia due to vitamin B12 deficiency; Fibromyalgia; and Hot flashes on their problem list. Health Maintenance:   Immunization History  Administered Date(s) Administered   PFIZER(Purple Top)SARS-COV-2 Vaccination 02/22/2020, 03/13/2020   Tdap 02/07/2017   Tetanus: 2018 Pneumovax: - discuss next visit, declines today Prevnar 13:  N/A Flu vaccine: N/A Shingrix: discucss age 51  No LMP recorded. Pap: 03/2018 normal neg HPV, due 3-5 years, plans to schedule with GYN later this year MGM: 11/2015 , reminded to schedule - planning on getting established with GYN, but will order to schedule at breast center due to family history  DEXA: n/a  CXR: 2018  Colonoscopy: DUE , - cologuard EGD:  Vision: wears glasses, sees annually Dental: Dr. Marland Kitchen?, 2022, goes q3022m  Patient Care Team: Lucky CowboyMcKeown, William, MD as PCP - General (Internal Medicine)  Surgical History:  She has a past surgical history that includes External ear surgery; Inner ear surgery; Middle ear surgery;  Bladder surgery; Cesarean section; mastoid surgery; Adenoidectomy (Bilateral); and Breast cyst excision (1994). Family History:  Herfamily history includes ADD / ADHD in her daughter and son; Brain cancer in her maternal grandmother; Breast cancer in her maternal grandmother; Cancer in her mother; Depression in her son; Diabetes in her father; Hearing loss in her daughter; Heart attack (age of onset: 255) in her paternal grandfather; Hypertension in her father; Learning disabilities in her daughter and son; Lung cancer in her maternal grandfather; Lung cancer (age of onset: 5764) in her maternal uncle; Rheum arthritis in her paternal grandmother. Social History:  She reports that she has quit smoking. Her smoking use included cigarettes. She has a 10.00 pack-year smoking history. She has never used smokeless tobacco. She reports current alcohol use of about 7.0 standard drinks of alcohol per week. She reports current drug use. Drug: Marijuana.  Review of Systems: Review of Systems  Constitutional:  Positive for diaphoresis (intermittent sweats). Negative for malaise/fatigue and weight  loss.  HENT:  Negative for hearing loss (chronic/unchanged) and tinnitus.   Eyes: Negative.  Negative for blurred vision and double vision.  Respiratory: Negative.  Negative for cough, sputum production, shortness of breath and wheezing.   Cardiovascular: Negative.  Negative for chest pain, palpitations, orthopnea, claudication, leg swelling and PND.  Gastrointestinal:  Negative for abdominal pain, blood in stool, constipation, diarrhea, heartburn, melena, nausea and vomiting.  Genitourinary: Negative.   Musculoskeletal:  Positive for joint pain (rheum follows) and myalgias. Negative for falls.  Skin:  Negative for rash.  Neurological: Negative.  Negative for dizziness, tingling, sensory change, weakness and headaches.  Endo/Heme/Allergies:  Negative for polydipsia.  Psychiatric/Behavioral:  Negative for depression,  memory loss, substance abuse and suicidal ideas. The patient is nervous/anxious. The patient does not have insomnia.   All other systems reviewed and are negative.   Physical Exam: Estimated body mass index is 30.79 kg/m as calculated from the following:   Height as of this encounter: 5\' 4"  (1.626 m).   Weight as of this encounter: 179 lb 6.4 oz (81.4 kg). BP (!) 142/100   Pulse 81   Temp (!) 96.8 F (36 C)   Ht 5\' 4"  (1.626 m)   Wt 179 lb 6.4 oz (81.4 kg)   SpO2 99%   BMI 30.79 kg/m  General Appearance: Well nourished, in no apparent distress.  Eyes: PERRLA, EOMs, conjunctiva no swelling or erythema Sinuses: No Frontal/maxillary tenderness  ENT/Mouth: Ext aud canals clear, R TM absent (chronic), normal light reflex with TM without erythema, bulging to left. Poor hearing, bil hearing aids. Good dentition. No erythema, swelling, or exudate on post pharynx. Tonsils not swollen or erythematous.  Neck: Supple, thyroid normal. No bruits  Respiratory: Respiratory effort normal, BS equal bilaterally without rales, rhonchi, wheezing or stridor.  Cardio: RRR without murmurs, rubs or gallops. Brisk peripheral pulses without edema.  Chest: symmetric, with normal excursions and percussion.  Breasts: Very dense and irregular throughout bilaterally, without nipple discharge, retractions.  Abdomen: Soft, nontender, no guarding, rebound, hernias, masses, or organomegaly.  Lymphatics: Non tender without lymphadenopathy.  Genitourinary: Defer - insufficient time, plan PAP next OV Musculoskeletal: Full ROM all peripheral extremities,5/5 strength, and normal gait.  Skin: Warm, dry without rashes, ecchymosis. She has scabbed lesion to R shoulder.  Neuro: Cranial nerves intact, reflexes equal bilaterally. Normal muscle tone, no cerebellar symptoms. Sensation intact.  Psych: Awake and oriented X 3, normal affect, Insight and Judgment appropriate.   EKG: NSR  , NP-C 10:18 AM [facility]  Adult & Adolescent Internal Medicine

## 2021-12-23 ENCOUNTER — Encounter: Payer: Self-pay | Admitting: Adult Health

## 2021-12-23 LAB — CBC WITH DIFFERENTIAL/PLATELET
Absolute Monocytes: 631 cells/uL (ref 200–950)
Basophils Absolute: 78 cells/uL (ref 0–200)
Basophils Relative: 1.2 %
Eosinophils Absolute: 59 cells/uL (ref 15–500)
Eosinophils Relative: 0.9 %
HCT: 45.1 % — ABNORMAL HIGH (ref 35.0–45.0)
Hemoglobin: 14.6 g/dL (ref 11.7–15.5)
Lymphs Abs: 1385 cells/uL (ref 850–3900)
MCH: 30.6 pg (ref 27.0–33.0)
MCHC: 32.4 g/dL (ref 32.0–36.0)
MCV: 94.5 fL (ref 80.0–100.0)
MPV: 9.9 fL (ref 7.5–12.5)
Monocytes Relative: 9.7 %
Neutro Abs: 4349 cells/uL (ref 1500–7800)
Neutrophils Relative %: 66.9 %
Platelets: 300 10*3/uL (ref 140–400)
RBC: 4.77 10*6/uL (ref 3.80–5.10)
RDW: 13.1 % (ref 11.0–15.0)
Total Lymphocyte: 21.3 %
WBC: 6.5 10*3/uL (ref 3.8–10.8)

## 2021-12-23 LAB — COMPLETE METABOLIC PANEL WITH GFR
AG Ratio: 1.4 (calc) (ref 1.0–2.5)
ALT: 18 U/L (ref 6–29)
AST: 36 U/L — ABNORMAL HIGH (ref 10–35)
Albumin: 4.4 g/dL (ref 3.6–5.1)
Alkaline phosphatase (APISO): 59 U/L (ref 37–153)
BUN: 8 mg/dL (ref 7–25)
CO2: 29 mmol/L (ref 20–32)
Calcium: 9.2 mg/dL (ref 8.6–10.4)
Chloride: 101 mmol/L (ref 98–110)
Creat: 0.58 mg/dL (ref 0.50–1.03)
Globulin: 3.1 g/dL (calc) (ref 1.9–3.7)
Glucose, Bld: 97 mg/dL (ref 65–99)
Potassium: 4.7 mmol/L (ref 3.5–5.3)
Sodium: 138 mmol/L (ref 135–146)
Total Bilirubin: 0.5 mg/dL (ref 0.2–1.2)
Total Protein: 7.5 g/dL (ref 6.1–8.1)
eGFR: 110 mL/min/{1.73_m2} (ref >=60)

## 2021-12-23 LAB — LIPID PANEL
Cholesterol: 285 mg/dL — ABNORMAL HIGH (ref <200)
HDL: 97 mg/dL (ref >=50)
LDL Cholesterol (Calc): 164 mg/dL (calc) — ABNORMAL HIGH
Non-HDL Cholesterol (Calc): 188 mg/dL (calc) — ABNORMAL HIGH (ref <130)
Total CHOL/HDL Ratio: 2.9 (calc) (ref <5.0)
Triglycerides: 122 mg/dL (ref <150)

## 2021-12-23 LAB — URINALYSIS, ROUTINE W REFLEX MICROSCOPIC
Bilirubin Urine: NEGATIVE
Glucose, UA: NEGATIVE
Hgb urine dipstick: NEGATIVE
Ketones, ur: NEGATIVE
Leukocytes,Ua: NEGATIVE
Nitrite: NEGATIVE
Protein, ur: NEGATIVE
Specific Gravity, Urine: 1.014 (ref 1.001–1.035)
pH: 7 (ref 5.0–8.0)

## 2021-12-23 LAB — MICROALBUMIN / CREATININE URINE RATIO
Creatinine, Urine: 45 mg/dL (ref 20–275)
Microalb, Ur: <0.2 mg/dL

## 2021-12-23 LAB — MAGNESIUM: Magnesium: 1.9 mg/dL (ref 1.5–2.5)

## 2021-12-23 LAB — HEMOGLOBIN A1C
Hgb A1c MFr Bld: 5 % of total Hgb (ref <5.7)
Mean Plasma Glucose: 97 mg/dL
eAG (mmol/L): 5.4 mmol/L

## 2021-12-23 LAB — VITAMIN D 25 HYDROXY (VIT D DEFICIENCY, FRACTURES): Vit D, 25-Hydroxy: 42 ng/mL (ref 30–100)

## 2021-12-23 LAB — TSH: TSH: 2.1 mIU/L

## 2021-12-23 LAB — VITAMIN B12: Vitamin B-12: 239 pg/mL (ref 200–1100)

## 2022-01-02 ENCOUNTER — Other Ambulatory Visit: Payer: Self-pay | Admitting: Nurse Practitioner

## 2022-01-02 DIAGNOSIS — F411 Generalized anxiety disorder: Secondary | ICD-10-CM

## 2022-01-02 MED ORDER — AMPHETAMINE-DEXTROAMPHETAMINE 20 MG PO TABS: ORAL_TABLET | ORAL | 0 refills | Status: DC

## 2022-01-02 MED ORDER — ALPRAZOLAM 0.5 MG PO TABS: 0.5000 mg | ORAL_TABLET | Freq: Two times a day (BID) | ORAL | 0 refills | Status: DC | PRN

## 2022-01-31 ENCOUNTER — Telehealth (HOSPITAL_BASED_OUTPATIENT_CLINIC_OR_DEPARTMENT_OTHER): Payer: Self-pay

## 2022-01-31 ENCOUNTER — Inpatient Hospital Stay (HOSPITAL_BASED_OUTPATIENT_CLINIC_OR_DEPARTMENT_OTHER): Admission: RE | Admit: 2022-01-31 | Payer: Self-pay | Source: Ambulatory Visit

## 2022-01-31 ENCOUNTER — Encounter (HOSPITAL_BASED_OUTPATIENT_CLINIC_OR_DEPARTMENT_OTHER): Payer: Self-pay

## 2022-02-01 ENCOUNTER — Other Ambulatory Visit: Payer: Self-pay | Admitting: Nurse Practitioner

## 2022-02-01 ENCOUNTER — Encounter: Payer: Self-pay | Admitting: Nurse Practitioner

## 2022-02-01 DIAGNOSIS — F411 Generalized anxiety disorder: Secondary | ICD-10-CM

## 2022-02-01 MED ORDER — ALPRAZOLAM 0.5 MG PO TABS: 0.5000 mg | ORAL_TABLET | Freq: Two times a day (BID) | ORAL | 0 refills | Status: DC | PRN

## 2022-02-01 MED ORDER — AMPHETAMINE-DEXTROAMPHETAMINE 20 MG PO TABS: ORAL_TABLET | ORAL | 0 refills | Status: DC

## 2022-02-02 MED ORDER — AMPHETAMINE-DEXTROAMPHETAMINE 20 MG PO TABS
ORAL_TABLET | ORAL | 0 refills | Status: DC
Start: 2022-02-02 — End: 2022-03-04

## 2022-02-28 ENCOUNTER — Telehealth: Payer: Self-pay

## 2022-02-28 ENCOUNTER — Other Ambulatory Visit: Payer: Self-pay | Admitting: Nurse Practitioner

## 2022-02-28 MED ORDER — ACYCLOVIR 400 MG PO TABS: ORAL_TABLET | ORAL | 1 refills | Status: AC

## 2022-02-28 NOTE — Telephone Encounter (Signed)
Patient is requesting a prescription for Acyclovir, 400mg , take 1 tablet three times a day when cold sores appear.   Not on current med list.

## 2022-02-28 NOTE — Progress Notes (Deleted)
Office Visit Note  Patient: Vanessa Lee             Date of Birth: Aug 15, 1970           MRN: [id number]             PCP: Lucky Cowboy, MD Referring: Lucky Cowboy, MD Visit Date: 03/14/2022 Occupation: @GUAROCC @  Subjective:  No chief complaint on file.   History of Present Illness: Vanessa Lee is a 51 y.o. female ***   Activities of Daily Living:  Patient reports morning stiffness for *** {minute/hour:19697}.   Patient {ACTIONS;DENIES/REPORTS:21021675::"Denies"} nocturnal pain.  Difficulty dressing/grooming: {ACTIONS;DENIES/REPORTS:21021675::"Denies"} Difficulty climbing stairs: {ACTIONS;DENIES/REPORTS:21021675::"Denies"} Difficulty getting out of chair: {ACTIONS;DENIES/REPORTS:21021675::"Denies"} Difficulty using hands for taps, buttons, cutlery, and/or writing: {ACTIONS;DENIES/REPORTS:21021675::"Denies"}  No Rheumatology ROS completed.   PMFS History:  Patient Active Problem List   Diagnosis Date Noted   B12 deficiency 12/22/2021   Fibromyalgia 12/22/2021   Hot flashes 12/22/2021   Vapes nicotine containing substance 12/22/2020   Hypertension 12/22/2020   Obesity (BMI 30.0-34.9) 12/20/2020   Vitamin D deficiency 12/20/2020   Hyperlipidemia 02/07/2018   Tobacco use disorder 09/30/2014   Major depression in remission (HCC)    GERD (gastroesophageal reflux disease)    ADD (attention deficit disorder)    Asthma     Past Medical History:  Diagnosis Date   ADD (attention deficit disorder)    Anemia    Asthma    Depression    GERD (gastroesophageal reflux disease)     Family History  Problem Relation Age of Onset   Cancer Mother        breast   Diabetes Father    Hypertension Father    Lung cancer Maternal Uncle 7   Breast cancer Maternal Grandmother    Brain cancer Maternal Grandmother    Lung cancer Maternal Grandfather        metastatic   Rheum arthritis Paternal Grandmother    Heart attack Paternal Grandfather 29   ADD / ADHD Daughter     Hearing loss Daughter    Learning disabilities Daughter    Learning disabilities Son    ADD / ADHD Son    Depression Son    Past Surgical History:  Procedure Laterality Date   ADENOIDECTOMY Bilateral    with tonsillectomy   BLADDER SURGERY     As a child, urethral dilation   BREAST CYST EXCISION  1994   CESAREAN SECTION     EXTERNAL EAR SURGERY     INNER EAR SURGERY     mastoid surgery     MIDDLE EAR SURGERY     Social History   Social History Narrative   Not on file   Immunization History  Administered Date(s) Administered   PFIZER(Purple Top)SARS-COV-2 Vaccination 02/22/2020, 03/13/2020   Tdap 02/07/2017     Objective: Vital Signs: There were no vitals taken for this visit.   Physical Exam   Musculoskeletal Exam: ***  CDAI Exam: CDAI Score: -- Patient Global: --; Provider Global: -- Swollen: --; Tender: -- Joint Exam 03/14/2022   No joint exam has been documented for this visit   There is currently no information documented on the homunculus. Go to the Rheumatology activity and complete the homunculus joint exam.  Investigation: No additional findings.  Imaging: No results found.  Recent Labs: Lab Results  Component Value Date   WBC 6.5 12/22/2021   HGB 14.6 12/22/2021   PLT 300 12/22/2021   NA 138 12/22/2021   K 4.7 12/22/2021  CL 101 12/22/2021   CO2 29 12/22/2021   GLUCOSE 97 12/22/2021   BUN 8 12/22/2021   CREATININE 0.58 12/22/2021   BILITOT 0.5 12/22/2021   ALKPHOS 55 01/24/2017   AST 36 (H) 12/22/2021   ALT 18 12/22/2021   PROT 7.5 12/22/2021   ALBUMIN 4.3 01/24/2017   CALCIUM 9.2 12/22/2021   GFRAA 127 12/22/2020    Speciality Comments: No specialty comments available.  Procedures:  No procedures performed Allergies: Codeine and Penicillins   Assessment / Plan:     Visit Diagnoses: No diagnosis found.  Orders: No orders of the defined types were placed in this encounter.  No orders of the defined types were placed  in this encounter.   Face-to-face time spent with patient was *** minutes. Greater than 50% of time was spent in counseling and coordination of care.  Follow-Up Instructions: No follow-ups on file.   Ellen Henri, CMA  Note - This record has been created using Animal nutritionist.  Chart creation errors have been sought, but may not always  have been located. Such creation errors do not reflect on  the standard of medical care.

## 2022-03-04 ENCOUNTER — Other Ambulatory Visit: Payer: Self-pay | Admitting: Nurse Practitioner

## 2022-03-04 DIAGNOSIS — F411 Generalized anxiety disorder: Secondary | ICD-10-CM

## 2022-03-06 MED ORDER — AMPHETAMINE-DEXTROAMPHETAMINE 20 MG PO TABS: ORAL_TABLET | ORAL | 0 refills | Status: DC

## 2022-03-06 MED ORDER — ALPRAZOLAM 0.5 MG PO TABS: 0.5000 mg | ORAL_TABLET | Freq: Two times a day (BID) | ORAL | 0 refills | Status: DC | PRN

## 2022-03-08 ENCOUNTER — Encounter: Payer: Self-pay | Admitting: Nurse Practitioner

## 2022-03-08 MED ORDER — AMPHETAMINE-DEXTROAMPHETAMINE 20 MG PO TABS
ORAL_TABLET | ORAL | 0 refills | Status: DC
Start: 2022-03-08 — End: 2022-03-12

## 2022-03-12 MED ORDER — AMPHETAMINE-DEXTROAMPHETAMINE 20 MG PO TABS
ORAL_TABLET | ORAL | 0 refills | Status: DC
Start: 2022-03-12 — End: 2022-04-13

## 2022-03-13 ENCOUNTER — Other Ambulatory Visit: Payer: Self-pay | Admitting: Nurse Practitioner

## 2022-03-13 ENCOUNTER — Ambulatory Visit: Payer: BC Managed Care – PPO | Admitting: Rheumatology

## 2022-03-13 DIAGNOSIS — F411 Generalized anxiety disorder: Secondary | ICD-10-CM

## 2022-03-13 MED ORDER — ALPRAZOLAM 0.5 MG PO TABS: 0.5000 mg | ORAL_TABLET | Freq: Two times a day (BID) | ORAL | 0 refills | Status: DC | PRN

## 2022-03-13 NOTE — Progress Notes (Signed)
PDMP is reviewed and appropriate  

## 2022-03-14 ENCOUNTER — Ambulatory Visit: Payer: Self-pay | Attending: Rheumatology | Admitting: Rheumatology

## 2022-03-14 DIAGNOSIS — Z720 Tobacco use: Secondary | ICD-10-CM

## 2022-03-14 DIAGNOSIS — M7711 Lateral epicondylitis, right elbow: Secondary | ICD-10-CM

## 2022-03-14 DIAGNOSIS — M19071 Primary osteoarthritis, right ankle and foot: Secondary | ICD-10-CM

## 2022-03-14 DIAGNOSIS — Z8709 Personal history of other diseases of the respiratory system: Secondary | ICD-10-CM

## 2022-03-14 DIAGNOSIS — F17200 Nicotine dependence, unspecified, uncomplicated: Secondary | ICD-10-CM

## 2022-03-14 DIAGNOSIS — Z8639 Personal history of other endocrine, nutritional and metabolic disease: Secondary | ICD-10-CM

## 2022-03-14 DIAGNOSIS — M19041 Primary osteoarthritis, right hand: Secondary | ICD-10-CM

## 2022-03-14 DIAGNOSIS — E559 Vitamin D deficiency, unspecified: Secondary | ICD-10-CM

## 2022-03-14 DIAGNOSIS — R21 Rash and other nonspecific skin eruption: Secondary | ICD-10-CM

## 2022-03-14 DIAGNOSIS — K219 Gastro-esophageal reflux disease without esophagitis: Secondary | ICD-10-CM

## 2022-03-14 DIAGNOSIS — F325 Major depressive disorder, single episode, in full remission: Secondary | ICD-10-CM

## 2022-03-14 DIAGNOSIS — R7989 Other specified abnormal findings of blood chemistry: Secondary | ICD-10-CM

## 2022-03-14 DIAGNOSIS — M2242 Chondromalacia patellae, left knee: Secondary | ICD-10-CM

## 2022-03-14 DIAGNOSIS — R768 Other specified abnormal immunological findings in serum: Secondary | ICD-10-CM

## 2022-03-14 DIAGNOSIS — M62838 Other muscle spasm: Secondary | ICD-10-CM

## 2022-03-14 DIAGNOSIS — M7918 Myalgia, other site: Secondary | ICD-10-CM

## 2022-03-28 ENCOUNTER — Ambulatory Visit: Payer: 59 | Admitting: Nurse Practitioner

## 2022-04-13 ENCOUNTER — Other Ambulatory Visit: Payer: Self-pay | Admitting: Nurse Practitioner

## 2022-04-13 DIAGNOSIS — F411 Generalized anxiety disorder: Secondary | ICD-10-CM

## 2022-04-13 MED ORDER — AMPHETAMINE-DEXTROAMPHETAMINE 20 MG PO TABS: ORAL_TABLET | ORAL | 0 refills | Status: DC

## 2022-05-10 ENCOUNTER — Other Ambulatory Visit: Payer: Self-pay | Admitting: Nurse Practitioner

## 2022-05-10 DIAGNOSIS — F411 Generalized anxiety disorder: Secondary | ICD-10-CM

## 2022-05-10 MED ORDER — ALPRAZOLAM 0.5 MG PO TABS: 0.5000 mg | ORAL_TABLET | Freq: Two times a day (BID) | ORAL | 0 refills | Status: DC | PRN

## 2022-05-12 MED ORDER — AMPHETAMINE-DEXTROAMPHETAMINE 20 MG PO TABS: ORAL_TABLET | ORAL | 0 refills | Status: DC

## 2022-06-10 ENCOUNTER — Other Ambulatory Visit: Payer: Self-pay | Admitting: Nurse Practitioner

## 2022-06-10 DIAGNOSIS — F411 Generalized anxiety disorder: Secondary | ICD-10-CM

## 2022-06-12 ENCOUNTER — Other Ambulatory Visit: Payer: Self-pay | Admitting: Nurse Practitioner

## 2022-06-13 MED ORDER — AMPHETAMINE-DEXTROAMPHETAMINE 20 MG PO TABS: ORAL_TABLET | ORAL | 0 refills | Status: DC

## 2022-06-15 ENCOUNTER — Other Ambulatory Visit: Payer: Self-pay | Admitting: Nurse Practitioner

## 2022-06-15 DIAGNOSIS — F411 Generalized anxiety disorder: Secondary | ICD-10-CM

## 2022-07-15 ENCOUNTER — Other Ambulatory Visit: Payer: Self-pay | Admitting: Nurse Practitioner

## 2022-07-16 ENCOUNTER — Other Ambulatory Visit: Payer: Self-pay | Admitting: Nurse Practitioner

## 2022-07-17 MED ORDER — AMPHETAMINE-DEXTROAMPHETAMINE 20 MG PO TABS: ORAL_TABLET | ORAL | 0 refills | Status: DC

## 2022-07-18 ENCOUNTER — Other Ambulatory Visit: Payer: Self-pay | Admitting: Nurse Practitioner

## 2022-07-18 DIAGNOSIS — F411 Generalized anxiety disorder: Secondary | ICD-10-CM

## 2022-07-18 MED ORDER — ALPRAZOLAM 0.5 MG PO TABS: 0.5000 mg | ORAL_TABLET | Freq: Two times a day (BID) | ORAL | 0 refills | Status: DC | PRN

## 2022-08-17 ENCOUNTER — Other Ambulatory Visit: Payer: Self-pay | Admitting: Nurse Practitioner

## 2022-08-17 DIAGNOSIS — F411 Generalized anxiety disorder: Secondary | ICD-10-CM

## 2022-08-17 MED ORDER — AMPHETAMINE-DEXTROAMPHETAMINE 20 MG PO TABS: ORAL_TABLET | ORAL | 0 refills | Status: DC

## 2022-08-17 MED ORDER — ALPRAZOLAM 0.5 MG PO TABS: 0.5000 mg | ORAL_TABLET | Freq: Two times a day (BID) | ORAL | 0 refills | Status: DC | PRN

## 2022-09-15 ENCOUNTER — Other Ambulatory Visit: Payer: Self-pay | Admitting: Nurse Practitioner

## 2022-09-15 DIAGNOSIS — F411 Generalized anxiety disorder: Secondary | ICD-10-CM

## 2022-09-16 ENCOUNTER — Other Ambulatory Visit: Payer: Self-pay | Admitting: Nurse Practitioner

## 2022-09-16 DIAGNOSIS — F411 Generalized anxiety disorder: Secondary | ICD-10-CM

## 2022-09-16 MED ORDER — AMPHETAMINE-DEXTROAMPHETAMINE 20 MG PO TABS: ORAL_TABLET | ORAL | 0 refills | Status: DC

## 2022-09-16 MED ORDER — ALPRAZOLAM 0.5 MG PO TABS: 0.5000 mg | ORAL_TABLET | Freq: Two times a day (BID) | ORAL | 0 refills | Status: DC | PRN

## 2022-10-13 ENCOUNTER — Telehealth: Payer: Self-pay | Admitting: Family Medicine

## 2022-10-13 DIAGNOSIS — B9689 Other specified bacterial agents as the cause of diseases classified elsewhere: Secondary | ICD-10-CM

## 2022-10-13 DIAGNOSIS — J019 Acute sinusitis, unspecified: Secondary | ICD-10-CM

## 2022-10-13 MED ORDER — DOXYCYCLINE HYCLATE 100 MG PO TABS: 100.0000 mg | ORAL_TABLET | Freq: Two times a day (BID) | ORAL | 0 refills | Status: AC

## 2022-10-13 MED ORDER — PREDNISONE 20 MG PO TABS: 20.0000 mg | ORAL_TABLET | Freq: Two times a day (BID) | ORAL | 0 refills | Status: AC

## 2022-10-13 NOTE — Progress Notes (Signed)
E-Visit for Sinus Problems  We are sorry that you are not feeling well.  Here is how we plan to help!  Based on what you have shared with me it looks like you have sinusitis.  Sinusitis is inflammation and infection in the sinus cavities of the head.  Based on your presentation I believe you most likely have Acute Bacterial Sinusitis.  This is an infection caused by bacteria and is treated with antibiotics. I have prescribed Doxycycline 100mg  by mouth twice a day for 10 days. I will also send prednisone.  You may use an oral decongestant such as Mucinex D or if you have glaucoma or high blood pressure use plain Mucinex. Saline nasal spray help and can safely be used as often as needed for congestion.  If you develop worsening sinus pain, fever or notice severe headache and vision changes, or if symptoms are not better after completion of antibiotic, please schedule an appointment with a health care provider.    Sinus infections are not as easily transmitted as other respiratory infection, however we still recommend that you avoid close contact with loved ones, especially the very young and elderly.  Remember to wash your hands thoroughly throughout the day as this is the number one way to prevent the spread of infection!  Home Care: Only take medications as instructed by your medical team. Complete the entire course of an antibiotic. Do not take these medications with alcohol. A steam or ultrasonic humidifier can help congestion.  You can place a towel over your head and breathe in the steam from hot water coming from a faucet. Avoid close contacts especially the very young and the elderly. Cover your mouth when you cough or sneeze. Always remember to wash your hands.  Get Help Right Away If: You develop worsening fever or sinus pain. You develop a severe head ache or visual changes. Your symptoms persist after you have completed your treatment plan.  Make sure you Understand these  instructions. Will watch your condition. Will get help right away if you are not doing well or get worse.  Thank you for choosing an e-visit.  Your e-visit answers were reviewed by a board certified advanced clinical practitioner to complete your personal care plan. Depending upon the condition, your plan could have included both over the counter or prescription medications.  Please review your pharmacy choice. Make sure the pharmacy is open so you can pick up prescription now. If there is a problem, you may contact your provider through [employer] and have the prescription routed to another pharmacy.  Your safety is important to [place]. If you have drug allergies check your prescription carefully.   For the next 24 hours you can use MyChart to ask questions about today's visit, request a non-urgent call back, or ask for a work or school excuse. You will get an email in the next two days asking about your experience. I hope that your e-visit has been valuable and will speed your recovery.    have provided 5 minutes of non face to face time during this encounter for chart review and documentation.

## 2022-10-16 ENCOUNTER — Other Ambulatory Visit: Payer: Self-pay | Admitting: Nurse Practitioner

## 2022-10-16 DIAGNOSIS — F411 Generalized anxiety disorder: Secondary | ICD-10-CM

## 2022-10-17 ENCOUNTER — Other Ambulatory Visit: Payer: Self-pay | Admitting: Nurse Practitioner

## 2022-10-17 DIAGNOSIS — F411 Generalized anxiety disorder: Secondary | ICD-10-CM

## 2022-10-17 MED ORDER — AMPHETAMINE-DEXTROAMPHETAMINE 20 MG PO TABS: ORAL_TABLET | ORAL | 0 refills | Status: DC

## 2022-10-17 MED ORDER — ALPRAZOLAM 0.5 MG PO TABS: 0.5000 mg | ORAL_TABLET | Freq: Two times a day (BID) | ORAL | 0 refills | Status: DC | PRN

## 2022-11-04 ENCOUNTER — Encounter: Payer: Self-pay | Admitting: Nurse Practitioner

## 2022-11-04 DIAGNOSIS — I10 Essential (primary) hypertension: Secondary | ICD-10-CM

## 2022-11-06 MED ORDER — LOSARTAN POTASSIUM 100 MG PO TABS: ORAL_TABLET | ORAL | 0 refills | Status: AC

## 2022-11-16 ENCOUNTER — Other Ambulatory Visit: Payer: Self-pay | Admitting: Nurse Practitioner

## 2022-11-16 DIAGNOSIS — F411 Generalized anxiety disorder: Secondary | ICD-10-CM

## 2022-11-16 MED ORDER — ALPRAZOLAM 0.5 MG PO TABS: 0.5000 mg | ORAL_TABLET | Freq: Two times a day (BID) | ORAL | 0 refills | Status: DC | PRN

## 2022-11-16 MED ORDER — AMPHETAMINE-DEXTROAMPHETAMINE 20 MG PO TABS: ORAL_TABLET | ORAL | 0 refills | Status: DC

## 2022-11-17 ENCOUNTER — Encounter: Payer: Self-pay | Admitting: Nurse Practitioner

## 2022-11-17 ENCOUNTER — Other Ambulatory Visit: Payer: Self-pay | Admitting: Nurse Practitioner

## 2022-11-17 DIAGNOSIS — F411 Generalized anxiety disorder: Secondary | ICD-10-CM

## 2022-11-27 ENCOUNTER — Telehealth: Payer: Self-pay | Admitting: Nurse Practitioner

## 2022-11-27 NOTE — Telephone Encounter (Signed)
Pt was seen in the ED over the weekend due to low BP, she is wanting to know if we can look over the notes from that and tell her what their conclusion was

## 2022-11-30 ENCOUNTER — Ambulatory Visit: Payer: 59 | Admitting: Nurse Practitioner

## 2022-12-17 ENCOUNTER — Other Ambulatory Visit: Payer: Self-pay | Admitting: Nurse Practitioner

## 2022-12-18 MED ORDER — AMPHETAMINE-DEXTROAMPHETAMINE 20 MG PO TABS: ORAL_TABLET | ORAL | 0 refills | Status: DC

## 2022-12-20 ENCOUNTER — Encounter: Payer: Self-pay | Admitting: Nurse Practitioner

## 2022-12-20 ENCOUNTER — Other Ambulatory Visit: Payer: Self-pay | Admitting: Nurse Practitioner

## 2022-12-20 DIAGNOSIS — F411 Generalized anxiety disorder: Secondary | ICD-10-CM

## 2022-12-20 MED ORDER — ALPRAZOLAM 0.5 MG PO TABS: ORAL_TABLET | ORAL | 0 refills | Status: DC

## 2022-12-25 ENCOUNTER — Encounter: Payer: 59 | Admitting: Nurse Practitioner

## 2023-01-17 ENCOUNTER — Other Ambulatory Visit: Payer: Self-pay | Admitting: Nurse Practitioner

## 2023-01-17 MED ORDER — AMPHETAMINE-DEXTROAMPHETAMINE 20 MG PO TABS: ORAL_TABLET | ORAL | 0 refills | Status: DC

## 2023-01-20 ENCOUNTER — Other Ambulatory Visit: Payer: Self-pay | Admitting: Nurse Practitioner

## 2023-01-20 DIAGNOSIS — F411 Generalized anxiety disorder: Secondary | ICD-10-CM

## 2023-01-22 ENCOUNTER — Encounter: Payer: Self-pay | Admitting: Nurse Practitioner

## 2023-01-22 ENCOUNTER — Other Ambulatory Visit: Payer: Self-pay | Admitting: Nurse Practitioner

## 2023-01-22 ENCOUNTER — Ambulatory Visit (INDEPENDENT_AMBULATORY_CARE_PROVIDER_SITE_OTHER): Payer: 59 | Admitting: Nurse Practitioner

## 2023-01-22 VITALS — BP 130/98 | HR 91 | Temp 97.7°F | Ht 64.0 in | Wt 187.6 lb

## 2023-01-22 DIAGNOSIS — Z1231 Encounter for screening mammogram for malignant neoplasm of breast: Secondary | ICD-10-CM

## 2023-01-22 DIAGNOSIS — Z131 Encounter for screening for diabetes mellitus: Secondary | ICD-10-CM

## 2023-01-22 DIAGNOSIS — Z136 Encounter for screening for cardiovascular disorders: Secondary | ICD-10-CM | POA: Diagnosis not present

## 2023-01-22 DIAGNOSIS — Z0001 Encounter for general adult medical examination with abnormal findings: Secondary | ICD-10-CM

## 2023-01-22 DIAGNOSIS — F325 Major depressive disorder, single episode, in full remission: Secondary | ICD-10-CM

## 2023-01-22 DIAGNOSIS — R768 Other specified abnormal immunological findings in serum: Secondary | ICD-10-CM

## 2023-01-22 DIAGNOSIS — I10 Essential (primary) hypertension: Secondary | ICD-10-CM | POA: Diagnosis not present

## 2023-01-22 DIAGNOSIS — E66811 Obesity, class 1: Secondary | ICD-10-CM

## 2023-01-22 DIAGNOSIS — Z124 Encounter for screening for malignant neoplasm of cervix: Secondary | ICD-10-CM

## 2023-01-22 DIAGNOSIS — Z79899 Other long term (current) drug therapy: Secondary | ICD-10-CM

## 2023-01-22 DIAGNOSIS — Z1211 Encounter for screening for malignant neoplasm of colon: Secondary | ICD-10-CM

## 2023-01-22 DIAGNOSIS — Z720 Tobacco use: Secondary | ICD-10-CM

## 2023-01-22 DIAGNOSIS — F419 Anxiety disorder, unspecified: Secondary | ICD-10-CM

## 2023-01-22 DIAGNOSIS — F988 Other specified behavioral and emotional disorders with onset usually occurring in childhood and adolescence: Secondary | ICD-10-CM

## 2023-01-22 DIAGNOSIS — Z0000 Encounter for general adult medical examination without abnormal findings: Secondary | ICD-10-CM

## 2023-01-22 DIAGNOSIS — F411 Generalized anxiety disorder: Secondary | ICD-10-CM

## 2023-01-22 DIAGNOSIS — M153 Secondary multiple arthritis: Secondary | ICD-10-CM

## 2023-01-22 DIAGNOSIS — K219 Gastro-esophageal reflux disease without esophagitis: Secondary | ICD-10-CM

## 2023-01-22 DIAGNOSIS — M797 Fibromyalgia: Secondary | ICD-10-CM

## 2023-01-22 DIAGNOSIS — R7689 Other specified abnormal immunological findings in serum: Secondary | ICD-10-CM

## 2023-01-22 DIAGNOSIS — E782 Mixed hyperlipidemia: Secondary | ICD-10-CM

## 2023-01-22 DIAGNOSIS — F17200 Nicotine dependence, unspecified, uncomplicated: Secondary | ICD-10-CM

## 2023-01-22 DIAGNOSIS — R232 Flushing: Secondary | ICD-10-CM

## 2023-01-22 DIAGNOSIS — E669 Obesity, unspecified: Secondary | ICD-10-CM

## 2023-01-22 DIAGNOSIS — E538 Deficiency of other specified B group vitamins: Secondary | ICD-10-CM

## 2023-01-22 DIAGNOSIS — Z1389 Encounter for screening for other disorder: Secondary | ICD-10-CM

## 2023-01-22 DIAGNOSIS — J45909 Unspecified asthma, uncomplicated: Secondary | ICD-10-CM

## 2023-01-22 DIAGNOSIS — E559 Vitamin D deficiency, unspecified: Secondary | ICD-10-CM

## 2023-01-22 LAB — CBC WITH DIFFERENTIAL/PLATELET
Eosinophils Absolute: 57 cells/uL (ref 15–500)
Lymphs Abs: 1271 cells/uL (ref 850–3900)
MPV: 10.2 fL (ref 7.5–12.5)
Neutro Abs: 6322 cells/uL (ref 1500–7800)
Neutrophils Relative %: 77.1 %
RBC: 4.6 10*6/uL (ref 3.80–5.10)

## 2023-01-22 MED ORDER — ALPRAZOLAM 0.5 MG PO TABS
ORAL_TABLET | ORAL | 0 refills | Status: DC
Start: 2023-01-22 — End: 2023-03-15

## 2023-01-22 MED ORDER — MELOXICAM 7.5 MG PO TABS
7.5000 mg | ORAL_TABLET | Freq: Every day | ORAL | 0 refills | Status: DC
Start: 2023-01-22 — End: 2023-02-20

## 2023-01-22 NOTE — Patient Instructions (Signed)
Meloxicam Capsules What is this medication? MELOXICAM (mel OX i cam) treats mild to moderate pain, inflammation, or arthritis. It works by decreasing inflammation. It belongs to a group of medications called NSAIDs. This medicine may be used for other purposes; ask your health care provider or pharmacist if you have questions. COMMON BRAND NAME(S): Vivlodex What should I tell my care team before I take this medication? They need to know if you have any of these conditions: Asthma (lung or breathing disease) Bleeding disorder Coronary artery bypass graft (CABG) within the past 2 weeks Dehydration Frequently drink alcohol Heart attack Heart disease Heart failure High blood pressure Kidney disease Liver disease Stomach bleeding Stomach ulcers, other stomach or intestine problems Take medications that treat or prevent blood clots Taking other steroids, such as dexamethasone or prednisone Tobacco use An unusual or allergic reaction to meloxicam, other medications, foods, dyes, or preservatives Pregnant or trying to get pregnant Breast-feeding How should I use this medication? Take this medication by mouth. Take it as directed on the prescription label at the same time every day. You can take it with or without food. If it upsets your stomach, take it with food. Do not use it more often than directed. There may be unused or extra doses in the bottle after you finish your treatment. Talk to your care team if you have questions about your dose. A special MedGuide will be given to you by the pharmacist with each prescription and refill. Be sure to read this information carefully each time. Talk to your care team about the use of this medication in children. Special care may be needed. People over 19 years of age may have a stronger reaction and need a smaller dose. Overdosage: If you think you have taken too much of this medicine contact a poison control center or emergency room at once. NOTE:  This medicine is only for you. Do not share this medicine with others. What if I miss a dose? If you miss a dose, take it as soon as you can. If it is almost time for your next dose, take only that dose. Do not take double or extra doses. What may interact with this medication? Do not take this medication with any of the following: Cidofovir Ketorolac This medication may also interact with the following: Alcohol Aspirin and aspirin-like medications Certain medications for blood pressure, heart disease, irregular heart beat Certain medications for mental health conditions Certain medications that treat or prevent blood clots, such as warfarin, enoxaparin, dalteparin, apixaban, dabigatran, rivaroxaban Cyclosporine Diuretics Fluconazole Lithium Methotrexate Other NSAIDs, medications for pain and inflammation, such as ibuprofen and naproxen Pemetrexed This list may not describe all possible interactions. Give your health care provider a list of all the medicines, herbs, non-prescription drugs, or dietary supplements you use. Also tell them if you smoke, drink alcohol, or use illegal drugs. Some items may interact with your medicine. What should I watch for while using this medication? Visit your care team for regular checks on your progress. Tell your care team if your symptoms do not start to get better or if they get worse. Do not take other medications that contain aspirin, ibuprofen, or naproxen with this medication. Side effects such as stomach upset, nausea, or ulcers may be more likely to occur. Many non-prescription medications contain aspirin, ibuprofen, or naproxen. Always read labels carefully. This medication can cause serious ulcers and bleeding in the stomach. It can happen with no warning. Tobacco, alcohol, older age, and poor health  can also increase risks. Call your care team right away if you have stomach pain or blood in your vomit or stool. This medication does not prevent a  heart attack or stroke. This medication may increase the chance of a heart attack or stroke. The chance may increase the longer you use this medication or if you have heart disease. If you take aspirin to prevent a heart attack or stroke, talk to your care team about using this medication. This medication may cause serious skin reactions. They can happen weeks to months after starting the medication. Contact your care team right away if you notice fevers or flu-like symptoms with a rash. The rash may be red or purple and then turn into blisters or peeling of the skin. Or, you might notice a red rash with swelling of the face, lips or lymph nodes in your neck or under your arms. Talk to your care team if you wish to become pregnant or think you might be pregnant. This medication can cause serious birth defects. This medication may affect your coordination, reaction time, or judgment. Do not drive or operate machinery until you know how this medication affects you. Sit up or stand slowly to reduce the risk of dizzy or fainting spells. Drinking alcohol with this medication can increase the risk of these side effects. Be careful brushing or flossing your teeth or using a toothpick because you may get an infection or bleed more easily. If you have any dental work done, tell your dentist you are receiving this medication. This medication may make it more difficult to get pregnant. Talk to your care team if you are concerned about your fertility. What side effects may I notice from receiving this medication? Side effects that you should report to your care team as soon as possible: Allergic reactions--skin rash, itching, hives, swelling of the face, lips, tongue, or throat Bleeding--bloody or black, tar-like stools, vomiting blood or brown material that looks like coffee grounds, red or dark brown urine, small red or purple spots on skin, unusual bruising or bleeding Heart attack--pain or tightness in the chest,  shoulders, arms, or jaw, nausea, shortness of breath, cold or clammy skin, feeling faint or lightheaded Heart failure--shortness of breath, swelling of ankles, feet, or hands, sudden weight gain, unusual weakness or fatigue Increase in blood pressure Kidney injury--decrease in the amount of urine, swelling of the ankles, hands, or feet Liver injury--right upper belly pain, loss of appetite, nausea, light-colored stool, dark yellow or brown urine, yellowing skin or eyes, unusual weakness or fatigue Rash, fever, and swollen lymph nodes Redness, blistering, peeling, or loosening of the skin, including inside the mouth Stroke--sudden numbness or weakness of the face, arm, or leg, trouble speaking, confusion, trouble walking, loss of balance or coordination, dizziness, severe headache, change in vision Side effects that usually do not require medical attention (report to your care team if they continue or are bothersome): Diarrhea Nausea Upset stomach This list may not describe all possible side effects. Call your doctor for medical advice about side effects. You may report side effects to FDA at 1-800-FDA-1088. Where should I keep my medication? Keep out of the reach of children and pets. Store at room temperature between 20 and 25 degrees C (68 and 77 degrees F). Keep this medication in the original container. Protect from moisture. Keep the container tightly closed. Get rid of any unused medication after the expiration date. To get rid of medications that are no longer needed or have  expired: Take the medication to a medication take-back program. Check with your pharmacy or law enforcement to find a location. If you cannot return the medication, check the label or package insert to see if the medication should be thrown out in the garbage or flushed down the toilet. If you are not sure, ask your care team. If it is safe to put it in the trash, empty the medication out of the container. Mix the  medication with cat litter, dirt, coffee grounds, or other unwanted substance. Seal the mixture in a bag or container. Put it in the trash. NOTE: This sheet is a summary. It may not cover all possible information. If you have questions about this medicine, talk to your doctor, pharmacist, or health care provider.  2024 Elsevier/Gold Standard (2021-03-16 00:00:00)

## 2023-01-22 NOTE — Progress Notes (Signed)
Complete Physical  Assessment and Plan:  Encounter for Annual Physical Exam with abnormal findings Due annually  Health Maintenance reviewed Healthy lifestyle reviewed and goals set  Hypertension Elevated in clinic - asymptomatic.  Discussed DASH (Dietary Approaches to Stop Hypertension) DASH diet is lower in sodium than a typical American diet. Cut back on foods that are high in saturated fat, cholesterol, and trans fats. Eat more whole-grain foods, fish, poultry, and nuts Remain active and exercise as tolerated daily.  Monitor BP at home-Call if greater than 130/80.  Check CMP/CBC  Recurrent major depressive disorder, in partial remission (HCC) Continue medications.  Stress management techniques discussed, increase water, good sleep hygiene discussed, increase exercise, and increase veggies.   Tobacco use disorder/Vape use  Smoking cessation-  instruction/counseling given, counseled patient on the dangers of tobacco use, advised patient to stop smoking, and reviewed strategies to maximize success, patient not ready to quit at this time but slowly tapering  Gastroesophageal reflux disease without esophagitis No suspected reflux complications (Barret/stricture). Lifestyle modification:  wt loss, avoid meals 2-3h before bedtime. Consider eliminating food triggers:  chocolate, caffeine, EtOH, acid/spicy food.  Uncomplicated asthma, unspecified asthma severity, unspecified whether persistent Avoid triggers, controlled at this time  Attention deficit disorder, unspecified hyperactivity presence Continue Adderall  Medications providing over 30% improvement of attention deficit hyperactivity disorder. No change in management. Using lowest effective dose. Discussed alternative pharmacological and non-pharmacological therapies. No suspected aberrant drug-taking behaviors. Continue to monitor  Medication management All medications discussed and reviewed in full. All questions and  concerns regarding medications addressed.    Hyperlipidemia, unspecified hyperlipidemia type Discussed lifestyle modifications. Recommended diet heavy in fruits and veggies, omega 3's. Decrease consumption of animal meats, cheeses, and dairy products. Remain active and exercise as tolerated. Continue to monitor. Check lipids/TSH  Vitamin D deficiency Continue supplement for goal of 60-100 Monitor Vitamin D levels  Positive ANA Otherwise negative autoimmune workup by Dr. Corliss Skains She follows annually and as needed for arthritic conditions  Encounter for screening mammogram for malignant neoplasm of breast Ordered to schedule at [facility] med center -   Anxiety/hot flashes Continue Lexapro Discussed Vehozah and black cohosh. Reviewed relaxation techniques.  Sleep hygiene. Recommended Cognitive Behavioral Therapy (CBT). PRN Recommended mindfulness meditation and exercise.   Insight-oriented psychotherapy given for 16 minutes exclusively. Psychoeducation:  encouraged personality growth wand development through coping techniques and problem-solving skills. Limit/Decrease/Monitor drug/alcohol intake.    Fibromyalgia Dr. Corliss Skains following Start new medication as prescribed - lexapro Stress management techniques discussed, increase water, good sleep hygiene discussed, increase exercise, and increase veggies.  Follow up 2-3 month, call the office if any new AE's from medications and we will switch them (consider cymbalta)  Screening colon cancer Due - cologuard due  Obesity Reach out to insurance to inquire about weight loss coverage. Discussed Saxenda versus Wegovy Discussed appropriate BMI Diet modification. Physical activity. Encouraged/praised to build confidence.  Screening for cervical cancer -PAP with HPV  Screening for cardiovascular condition  -EKG  Screening for hematuria or proteinuria  -UA/Microalbumin  Screening for DM -A1c/Insulin  Osteoarthritis Stop  Ibuprofen Start Meloxicam Alternate Ice/Heat Rest when flared  B12 Deficiency Monitor levels   Orders Placed This Encounter  Procedures   MM Digital Screening    Standing Status:   Future    Standing Expiration Date:   01/22/2024    Order Specific Question:   Reason for Exam (SYMPTOM  OR DIAGNOSIS REQUIRED)    Answer:   Screening    Order Specific  Question:   Is the patient pregnant?    Answer:   No    Order Specific Question:   Preferred imaging location?    Answer:   [facility]   CBC with Differential/Platelet   COMPLETE METABOLIC PANEL WITH GFR   Magnesium   Lipid panel   TSH   Hemoglobin A1c   Insulin, random   VITAMIN D 25 Hydroxy (Vit-D Deficiency, Fractures)   Urinalysis, Routine w reflex microscopic   Microalbumin / creatinine urine ratio   Vitamin B12   EKG 12-Lead    Notify office for further evaluation and treatment, questions or concerns if any reported s/s fail to improve.   The patient was advised to call back or seek an in-person evaluation if any symptoms worsen or if the condition fails to improve as anticipated.   Further disposition pending results of labs. Discussed med's effects and SE's.    I discussed the assessment and treatment plan with the patient. The patient was provided an opportunity to ask questions and all were answered. The patient agreed with the plan and demonstrated an understanding of the instructions.  Discussed med's effects and SE's. Screening labs and tests as requested with regular follow-up as recommended.  I provided 35 minutes of face-to-face time during this encounter including counseling, chart review, and critical decision making was preformed.  Today's Plan of Care is based on a patient-centered health care approach known as shared decision making - the decisions, tests and treatments allow for patient preferences and values to be balanced with clinical evidence.     Future Appointments  Date Time Provider  Department Center  01/22/2024 10:00 AM Adela Glimpse, NP [facility] None     HPI  52 y.o. female  presents for a complete physical and follow up for has Major depression in remission [facility]); GERD (gastroesophageal reflux disease); ADD (attention deficit disorder); Asthma; Tobacco use disorder; Hyperlipidemia; Obesity (BMI 30.0-34.9); Vitamin D deficiency; Vapes nicotine containing substance; Hypertension; B12 deficiency; Fibromyalgia; and Hot flashes on their problem list.    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  She is divorced, 2 kids. Lots of stress, gets taken to court every year by her ex.   She is still having cycles but heavy and irregular, never scheduled with GYN, PAP overdue, some concerns with labile mood, hot flashes.    She was seen 12/05/19 for polyarthralgia, rash, nail changes, mouth sores- she was checked sed rate, AntiDNA, RF which were negative. She had a positive ANA but with low titer 1:80. She was evaluated by Dr. Corliss Skains with otherwise unremarkable autoimmune workup, felt OA/condromalacia, also fibromyalgia.  Has noticed increase in generalized arthritic pain. Takes daily Ibuprofen and alternates with Tylenol.  She does not feel as though this is effective.  She has had stress fighting with her ex, depression in remission, but reports recently with perimenopause more labile mood, anxiety. Currently on PRN xanax, interested in daily agent.   Patient is on an ADD medication, she states that the medication is helping and she denies any adverse reactions. She currently takes 1-2 tabs on days that she works, Mon-Fri.   she currently vapes daily, CBD/marijuana product, helps more than anything with chronic pain; discussed risks associated with smoking, receptive to trying other formulations, goal to quit all smoking.   She has GERD well controlled recently, takes omperazole PRN, only needing once a week or so.   BMI is Body mass index is 32.2 kg/m., she  has been working  on diet and exercise, does have 1 drink nightly, receptive to reduce/quit, would like to lose some weight.  Wt Readings from Last 3 Encounters:  01/22/23 187 lb 9.6 oz (85.1 kg)  12/22/21 179 lb 6.4 oz (81.4 kg)  03/14/21 168 lb 12.8 oz (76.6 kg)   She does not check BPs at home, today their BP is  BP Readings from Last 3 Encounters:  01/22/23 (!) 130/98  12/22/21 (!) 142/100  03/14/21 131/89  She does not workout. She denies chest pain, shortness of breath, dizziness. She does endorse intermittent palpitations.   She is not on cholesterol medication and denies myalgias. Her cholesterol is not at goal. The cholesterol last visit was:   Lab Results  Component Value Date   CHOL 285 (H) 12/22/2021   HDL 97 12/22/2021   LDLCALC 164 (H) 12/22/2021   TRIG 122 12/22/2021   CHOLHDL 2.9 12/22/2021    Last A1C in the office was:  Lab Results  Component Value Date   HGBA1C 5.0 12/22/2021   Patient is currently on 5000 IU Vitamin D supplement.   Lab Results  Component Value Date   VD25OH 42 12/22/2021      Lab Results  Component Value Date   VITAMINB12 239 12/22/2021      Current Medications:  Current Outpatient Medications on File Prior to Visit  Medication Sig Dispense Refill   acyclovir (ZOVIRAX) 400 MG tablet Take 1 pill 3 times daily at start on cold sore (Patient taking differently: Take 1 pill 3 times daily at start on cold sore as needed) 50 tablet 1   ALPRAZolam (XANAX) 0.5 MG tablet TAKE 1 TABLET BY MOUTH TWICE DAILY AS NEEDED FOR SEVERE ANXIETY OR PANIC ATTACKS Strength: 0.5 mg 45 tablet 0   amphetamine-dextroamphetamine (ADDERALL) 20 MG tablet Take 1/2-1 tablet 1-2 x /day ONLY as needed for ADD, avoid taking daily to avoid tolerance and addiction. Script should last longer than 30 days. 60 tablet 0   cetirizine (ZYRTEC) 10 MG tablet Take 10 mg by mouth daily.     CHOLECALCIFEROL PO Take 10,000 Units by mouth daily.     Multiple Vitamin (MULTIVITAMIN  PO) Take by mouth daily.     OMEPRAZOLE PO Take by mouth daily.     triamcinolone cream (KENALOG) 0.5 % Apply 1 application topically 2 (two) times daily. (Patient taking differently: Apply 1 application  topically as needed.) 80 g 2   escitalopram (LEXAPRO) 10 MG tablet Take 1 tab daily for mood and hot flashes. (Patient not taking: Reported on 01/22/2023) 90 tablet 3   famotidine (PEPCID) 20 MG tablet TAKE 1 TABLET BY MOUTH TWICE DAILY WITH MEALS TO PREVENT HEARTBURN AND INDIGESTION (Patient not taking: Reported on 01/22/2023) 180 tablet 3   Loratadine-Pseudoephedrine (CLARITIN-D 24 HOUR PO) Take by mouth daily. (Patient not taking: Reported on 01/22/2023)     losartan (COZAAR) 100 MG tablet Take 1 tab daily for blood pressure goal <130/80. (Patient not taking: Reported on 01/22/2023) 90 tablet 0   No current facility-administered medications on file prior to visit.   Allergies:  Allergies  Allergen Reactions   Codeine Nausea And Vomiting   Penicillins Nausea And Vomiting   Medical History:  She has Major depression in remission [facility]); GERD (gastroesophageal reflux disease); ADD (attention deficit disorder); Asthma; Tobacco use disorder; Hyperlipidemia; Obesity (BMI 30.0-34.9); Vitamin D deficiency; Vapes nicotine containing substance; Hypertension; B12 deficiency; Fibromyalgia; and Hot flashes on their problem list. Health Maintenance:   Immunization History  Administered  Date(s) Administered   PFIZER(Purple Top)SARS-COV-2 Vaccination 02/22/2020, 03/13/2020   Tdap 02/07/2017   Tetanus: 2018 Pneumovax: - discuss next visit, declines today Prevnar 13:  N/A Flu vaccine: N/A Shingrix: discucss age 56  LMP recorded. 09/2022. Patient is perimenopausal.  Pap: 03/2018 normal neg HPV, due 3-5 years, plans to schedule with GYN later this year  MGM: 05/201 Due - Ordered.  Requested to have in [place] DEXA: n/a  CXR: 2018  Colonoscopy: DUE , - cologuard ordred  EGD:  Vision: wears glasses, sees  annually 2024 Dental: 2024 goes q18m  Patient Care Team: Lucky Cowboy, MD as PCP - General (Internal Medicine)  Surgical History:  She has a past surgical history that includes External ear surgery; Inner ear surgery; Middle ear surgery; Bladder surgery; Cesarean section; mastoid surgery; Adenoidectomy (Bilateral); and Breast cyst excision (1994). Family History:  Herfamily history includes ADD / ADHD in her daughter and son; Brain cancer in her maternal grandmother; Breast cancer in her maternal grandmother; Cancer in her mother; Depression in her son; Diabetes in her father; Hearing loss in her daughter; Heart attack (age of onset: 15) in her paternal grandfather; Hypertension in her father; Learning disabilities in her daughter and son; Lung cancer in her maternal grandfather; Lung cancer (age of onset: 20) in her maternal uncle; Rheum arthritis in her paternal grandmother. Social History:  She reports that she has quit smoking. Her smoking use included cigarettes. She has a 10.00 pack-year smoking history. She has never used smokeless tobacco. She reports current alcohol use of about 7.0 standard drinks of alcohol per week. She reports current drug use. Drug: Marijuana.  Review of Systems: Review of Systems  Constitutional:  Positive for diaphoresis (intermittent sweats). Negative for malaise/fatigue and weight loss.  HENT:  Negative for hearing loss (chronic/unchanged) and tinnitus.   Eyes: Negative.  Negative for blurred vision and double vision.  Respiratory: Negative.  Negative for cough, sputum production, shortness of breath and wheezing.   Cardiovascular: Negative.  Negative for chest pain, palpitations, orthopnea, claudication, leg swelling and PND.  Gastrointestinal:  Negative for abdominal pain, blood in stool, constipation, diarrhea, heartburn, melena, nausea and vomiting.  Genitourinary: Negative.   Musculoskeletal:  Positive for joint pain (rheum follows) and myalgias.  Negative for falls.  Skin:  Negative for rash.  Neurological: Negative.  Negative for dizziness, tingling, sensory change, weakness and headaches.  Endo/Heme/Allergies:  Negative for polydipsia.  Psychiatric/Behavioral:  Negative for depression, memory loss, substance abuse and suicidal ideas. The patient is nervous/anxious. The patient does not have insomnia.   All other systems reviewed and are negative.   Physical Exam: Estimated body mass index is 32.2 kg/m as calculated from the following:   Height as of this encounter: 5\' 4"  (1.626 m).   Weight as of this encounter: 187 lb 9.6 oz (85.1 kg). BP (!) 130/98   Pulse 91   Temp 97.7 F (36.5 C)   Ht 5\' 4"  (1.626 m)   Wt 187 lb 9.6 oz (85.1 kg)   SpO2 99%   BMI 32.20 kg/m  General Appearance: Well nourished, in no apparent distress.  Eyes: PERRLA, EOMs, conjunctiva no swelling or erythema Sinuses: No Frontal/maxillary tenderness  ENT/Mouth: Ext aud canals clear, R TM absent (chronic), normal light reflex with TM without erythema, bulging to left. Poor hearing, bil hearing aids. Good dentition. No erythema, swelling, or exudate on post pharynx. Tonsils not swollen or erythematous.  Neck: Supple, thyroid normal. No bruits  Respiratory: Respiratory effort normal, BS equal  bilaterally without rales, rhonchi, wheezing or stridor.  Cardio: RRR without murmurs, rubs or gallops. Brisk peripheral pulses without edema.  Chest: symmetric, with normal excursions and percussion.  Breasts: Very dense and irregular throughout bilaterally, without nipple discharge, retractions.  Abdomen: Soft, nontender, no guarding, rebound, hernias, masses, or organomegaly.  Lymphatics: Non tender without lymphadenopathy.  Genitourinary: Defer - insufficient time, plan PAP next OV Musculoskeletal: Full ROM all peripheral extremities,5/5 strength, and normal gait.  Skin: Warm, dry without rashes, ecchymosis. She has scabbed lesion to R shoulder.  Neuro: Cranial  nerves intact, reflexes equal bilaterally. Normal muscle tone, no cerebellar symptoms. Sensation intact.  Psych: Awake and oriented X 3, normal affect, Insight and Judgment appropriate.   EKG: NSR  Loyce Klasen, NP-C 10:25 AM [facility]

## 2023-01-23 LAB — COMPLETE METABOLIC PANEL WITH GFR
AG Ratio: 1.5 (calc) (ref 1.0–2.5)
ALT: 36 U/L — ABNORMAL HIGH (ref 6–29)
AST: 82 U/L — ABNORMAL HIGH (ref 10–35)
Albumin: 4.5 g/dL (ref 3.6–5.1)
Alkaline phosphatase (APISO): 74 U/L (ref 37–153)
BUN: 8 mg/dL (ref 7–25)
CO2: 27 mmol/L (ref 20–32)
Calcium: 9.5 mg/dL (ref 8.6–10.4)
Chloride: 102 mmol/L (ref 98–110)
Creat: 0.61 mg/dL (ref 0.50–1.03)
Globulin: 3 g/dL (calc) (ref 1.9–3.7)
Glucose, Bld: 111 mg/dL — ABNORMAL HIGH (ref 65–99)
Potassium: 4.2 mmol/L (ref 3.5–5.3)
Sodium: 139 mmol/L (ref 135–146)
Total Bilirubin: 0.5 mg/dL (ref 0.2–1.2)
Total Protein: 7.5 g/dL (ref 6.1–8.1)
eGFR: 108 mL/min/{1.73_m2} (ref >=60)

## 2023-01-23 LAB — CBC WITH DIFFERENTIAL/PLATELET
Absolute Monocytes: 492 cells/uL (ref 200–950)
Basophils Absolute: 57 cells/uL (ref 0–200)
Basophils Relative: 0.7 %
Eosinophils Relative: 0.7 %
HCT: 42.4 % (ref 35.0–45.0)
Hemoglobin: 14.1 g/dL (ref 11.7–15.5)
MCH: 30.7 pg (ref 27.0–33.0)
MCHC: 33.3 g/dL (ref 32.0–36.0)
MCV: 92.2 fL (ref 80.0–100.0)
Monocytes Relative: 6 %
Platelets: 285 10*3/uL (ref 140–400)
RDW: 14 % (ref 11.0–15.0)
Total Lymphocyte: 15.5 %
WBC: 8.2 10*3/uL (ref 3.8–10.8)

## 2023-01-23 LAB — URINALYSIS, ROUTINE W REFLEX MICROSCOPIC
Bilirubin Urine: NEGATIVE
Glucose, UA: NEGATIVE
Hgb urine dipstick: NEGATIVE
Ketones, ur: NEGATIVE
Leukocytes,Ua: NEGATIVE
Nitrite: NEGATIVE
Protein, ur: NEGATIVE
Specific Gravity, Urine: 1.017 (ref 1.001–1.035)
pH: 5.5 (ref 5.0–8.0)

## 2023-01-23 LAB — LIPID PANEL
Cholesterol: 341 mg/dL — ABNORMAL HIGH (ref <200)
HDL: 86 mg/dL (ref >=50)
LDL Cholesterol (Calc): 222 mg/dL (calc) — ABNORMAL HIGH
Non-HDL Cholesterol (Calc): 255 mg/dL (calc) — ABNORMAL HIGH (ref <130)
Total CHOL/HDL Ratio: 4 (calc) (ref <5.0)
Triglycerides: 167 mg/dL — ABNORMAL HIGH (ref <150)

## 2023-01-23 LAB — TSH: TSH: 2.27 mIU/L

## 2023-01-23 LAB — VITAMIN D 25 HYDROXY (VIT D DEFICIENCY, FRACTURES): Vit D, 25-Hydroxy: 56 ng/mL (ref 30–100)

## 2023-01-23 LAB — HEMOGLOBIN A1C
Hgb A1c MFr Bld: 5.3 % of total Hgb (ref <5.7)
Mean Plasma Glucose: 105 mg/dL
eAG (mmol/L): 5.8 mmol/L

## 2023-01-23 LAB — MICROALBUMIN / CREATININE URINE RATIO
Creatinine, Urine: 77 mg/dL (ref 20–275)
Microalb Creat Ratio: 3 mg/g creat (ref <30)
Microalb, Ur: 0.2 mg/dL

## 2023-01-23 LAB — INSULIN, RANDOM: Insulin: 12.9 u[IU]/mL

## 2023-01-23 LAB — MAGNESIUM: Magnesium: 1.9 mg/dL (ref 1.5–2.5)

## 2023-01-23 LAB — VITAMIN B12: Vitamin B-12: 324 pg/mL (ref 200–1100)

## 2023-01-24 LAB — PAP, TP IMAGING W/ HPV RNA, RFLX HPV TYPE 16,18/45: HPV DNA High Risk: NOT DETECTED

## 2023-01-24 LAB — PAP, TP IMAGING, WNL RFLX HPV

## 2023-02-06 ENCOUNTER — Inpatient Hospital Stay (HOSPITAL_BASED_OUTPATIENT_CLINIC_OR_DEPARTMENT_OTHER): Admission: RE | Admit: 2023-02-06 | Payer: Self-pay | Source: Ambulatory Visit

## 2023-02-13 ENCOUNTER — Telehealth (HOSPITAL_BASED_OUTPATIENT_CLINIC_OR_DEPARTMENT_OTHER): Payer: Self-pay

## 2023-02-16 ENCOUNTER — Other Ambulatory Visit: Payer: Self-pay | Admitting: Nurse Practitioner

## 2023-02-16 MED ORDER — AMPHETAMINE-DEXTROAMPHETAMINE 20 MG PO TABS: ORAL_TABLET | ORAL | 0 refills | Status: DC

## 2023-02-20 ENCOUNTER — Other Ambulatory Visit: Payer: Self-pay | Admitting: Nurse Practitioner

## 2023-02-20 DIAGNOSIS — R768 Other specified abnormal immunological findings in serum: Secondary | ICD-10-CM

## 2023-02-20 DIAGNOSIS — Z0001 Encounter for general adult medical examination with abnormal findings: Secondary | ICD-10-CM

## 2023-02-20 DIAGNOSIS — Z79899 Other long term (current) drug therapy: Secondary | ICD-10-CM

## 2023-02-20 DIAGNOSIS — M153 Secondary multiple arthritis: Secondary | ICD-10-CM

## 2023-02-21 ENCOUNTER — Telehealth (HOSPITAL_BASED_OUTPATIENT_CLINIC_OR_DEPARTMENT_OTHER): Payer: Self-pay

## 2023-03-15 ENCOUNTER — Other Ambulatory Visit: Payer: Self-pay | Admitting: Nurse Practitioner

## 2023-03-15 DIAGNOSIS — F411 Generalized anxiety disorder: Secondary | ICD-10-CM

## 2023-03-15 MED ORDER — ALPRAZOLAM 0.5 MG PO TABS
ORAL_TABLET | ORAL | 0 refills | Status: DC
Start: 2023-03-15 — End: 2023-04-25

## 2023-03-19 ENCOUNTER — Other Ambulatory Visit: Payer: Self-pay | Admitting: Nurse Practitioner

## 2023-03-20 ENCOUNTER — Other Ambulatory Visit: Payer: Self-pay | Admitting: Nurse Practitioner

## 2023-03-20 ENCOUNTER — Encounter: Payer: Self-pay | Admitting: Nurse Practitioner

## 2023-03-24 ENCOUNTER — Other Ambulatory Visit: Payer: Self-pay | Admitting: Nurse Practitioner

## 2023-03-26 MED ORDER — AMPHETAMINE-DEXTROAMPHETAMINE 20 MG PO TABS: ORAL_TABLET | ORAL | 0 refills | Status: DC

## 2023-03-27 ENCOUNTER — Telehealth: Payer: Self-pay | Admitting: Nurse Practitioner

## 2023-03-27 NOTE — Telephone Encounter (Signed)
patient called to report golf ball size lump in Right breast @ 9am near nipple since . Patient states she called [facility] to schedule screening and reported lump. Scheduler advised patient to call pcp to evaluate. Patient scheduled for 03/29/23 9:30am w/ Adela Glimpse.

## 2023-03-29 ENCOUNTER — Ambulatory Visit (INDEPENDENT_AMBULATORY_CARE_PROVIDER_SITE_OTHER): Payer: 59 | Admitting: Nurse Practitioner

## 2023-03-29 ENCOUNTER — Encounter: Payer: Self-pay | Admitting: Nurse Practitioner

## 2023-03-29 VITALS — BP 138/100 | HR 99 | Temp 97.9°F | Ht 64.0 in | Wt 187.0 lb

## 2023-03-29 DIAGNOSIS — Z803 Family history of malignant neoplasm of breast: Secondary | ICD-10-CM

## 2023-03-29 DIAGNOSIS — N6312 Unspecified lump in the right breast, upper inner quadrant: Secondary | ICD-10-CM | POA: Diagnosis not present

## 2023-03-29 DIAGNOSIS — I10 Essential (primary) hypertension: Secondary | ICD-10-CM | POA: Diagnosis not present

## 2023-03-29 DIAGNOSIS — Z780 Asymptomatic menopausal state: Secondary | ICD-10-CM | POA: Diagnosis not present

## 2023-03-29 NOTE — Progress Notes (Signed)
Assessment and Plan:  Vanessa Lee was seen today for an episodic visit.  Diagnoses and all order for this visit:  Mass of upper inner quadrant of right breast  - MM 3D DIAGNOSTIC MAMMOGRAM BILATERAL BREAST; Future - [place] LIMITED ULTRASOUND INCLUDING AXILLA RIGHT BREAST; Future  Family history of breast cancer in mother  - MM 3D DIAGNOSTIC MAMMOGRAM BILATERAL BREAST; Future - [place] LIMITED ULTRASOUND INCLUDING AXILLA RIGHT BREAST; Future  Menopause Continue to track menstruation cycles.   Hypertension Noncompliant with medications Discussed importance of taking medications as directed. Discussed DASH (Dietary Approaches to Stop Hypertension) DASH diet is lower in sodium than a typical American diet. Cut back on foods that are high in saturated fat, cholesterol, and trans fats. Eat more whole-grain foods, fish, poultry, and nuts Remain active and exercise as tolerated daily.  Monitor BP at home-Call if greater than 130/80.  Check CMP/CBC    Notify office for further evaluation and treatment, questions or concerns if s/s fail to improve. The risks and benefits of my recommendations, as well as other treatment options were discussed with the patient today. Questions were answered.  Further disposition pending results of labs. Discussed med's effects and SE's.    Over 20 minutes of exam, counseling, chart review, and critical decision making was performed.   Future Appointments  Date Time Provider Department Center  07/26/2023  9:30 AM Adela Glimpse, NP [facility] None  01/22/2024 10:00 AM Umer Harig, Archie Patten, NP [facility] None    ------------------------------------------------------------------------------------------------------------------   HPI BP (!) 138/100   Pulse 99   Temp 97.9 F (36.6 C)   Ht 5\' 4"  (1.626 m)   Wt 187 lb (84.8 kg)   SpO2 98%   BMI 32.10 kg/m   Vanessa Lee presents for evaluation of right breast mass that she feels has been present for the  last 6 months.  She does report completing breast exams occassionally, however, area of concern has become more noticeable as she began to feel a "pulling" on the areola.  She also noticed some assymetry of the right breast.  She reports her mother had a hx of breast cancer, diagnosed at age 62 - she was unable to recall which type.  She is not UTD on screenign mammograms.  Last completed 11/2015 and normal.  Denies nipple discharge, skin changes.  She does endorse mild pain upon palpation.  Her BP continues to be elevated in clinic.  She was prescribed Losartan but does not take - reports noncompliance.  She denies CP, heart palpitations, SOB, syncope.   BP Readings from Last 3 Encounters:  03/29/23 (!) 138/100  01/22/23 (!) 130/98  12/22/21 (!) 142/100     Past Medical History:  Diagnosis Date   ADD (attention deficit disorder)    Anemia    Asthma    Depression    GERD (gastroesophageal reflux disease)      Allergies  Allergen Reactions   Codeine Nausea And Vomiting   Penicillins Nausea And Vomiting    Current Outpatient Medications on File Prior to Visit  Medication Sig   acyclovir (ZOVIRAX) 400 MG tablet Take 1 pill 3 times daily at start on cold sore (Patient taking differently: Take 1 pill 3 times daily at start on cold sore as needed)   ALPRAZolam (XANAX) 0.5 MG tablet TAKE 1 TABLET BY MOUTH TWICE DAILY AS NEEDED FOR SEVERE ANXIETY OR PANIC ATTACKS Strength: 0.5 mg   amphetamine-dextroamphetamine (ADDERALL) 20 MG tablet Take 1/2-1 tablet 1-2 x /day ONLY as needed for  ADD, avoid taking daily to avoid tolerance and addiction. Script should last longer than 30 days.   cetirizine (ZYRTEC) 10 MG tablet Take 10 mg by mouth daily.   CHOLECALCIFEROL PO Take 10,000 Units by mouth daily.   famotidine (PEPCID) 20 MG tablet TAKE 1 TABLET BY MOUTH TWICE DAILY WITH MEALS TO PREVENT HEARTBURN AND INDIGESTION   meloxicam (MOBIC) 7.5 MG tablet TAKE 1 TABLET BY MOUTH EVERY DAY   Multiple  Vitamin (MULTIVITAMIN PO) Take by mouth daily.   OMEPRAZOLE PO Take by mouth daily.   triamcinolone cream (KENALOG) 0.5 % Apply 1 application topically 2 (two) times daily. (Patient taking differently: Apply 1 application  topically as needed.)   escitalopram (LEXAPRO) 10 MG tablet Take 1 tab daily for mood and hot flashes. (Patient not taking: Reported on 01/22/2023)   Loratadine-Pseudoephedrine (CLARITIN-D 24 HOUR PO) Take by mouth daily. (Patient not taking: Reported on 01/22/2023)   losartan (COZAAR) 100 MG tablet Take 1 tab daily for blood pressure goal <130/80. (Patient not taking: Reported on 01/22/2023)   No current facility-administered medications on file prior to visit.    ROS: all negative except what is noted in the HPI.   Physical Exam:  BP (!) 138/100   Pulse 99   Temp 97.9 F (36.6 C)   Ht 5\' 4"  (1.626 m)   Wt 187 lb (84.8 kg)   SpO2 98%   BMI 32.10 kg/m   General Appearance: NAD.  Awake, conversant and cooperative. Eyes: PERRLA, EOMs intact.  Sclera white.  Conjunctiva without erythema. Sinuses: No frontal/maxillary tenderness.  No nasal discharge. Nares patent.  ENT/Mouth: Ext aud canals clear.  Bilateral TMs w/DOL and without erythema or bulging. Hearing intact.  Posterior pharynx without swelling or exudate.  Tonsils without swelling or erythema.  Neck: Supple.  No masses, nodules or thyromegaly. Respiratory: Effort is regular with non-labored breathing. Breath sounds are equal bilaterally without rales, rhonchi, wheezing or stridor.  Cardio: RRR with no MRGs. Brisk peripheral pulses without edema.  Abdomen: Active BS in all four quadrants.  Soft and non-tender without guarding, rebound tenderness, hernias or masses. Lymphatics: Non tender without lymphadenopathy.  Musculoskeletal: Full ROM, 5/5 strength, normal ambulation.  No clubbing or cyanosis. Skin: Appropriate color for ethnicity. Warm without rashes, lesions, ecchymosis, ulcers.  Neuro: CN II-XII grossly  normal. Normal muscle tone without cerebellar symptoms and intact sensation.   Psych: AO X 3,  appropriate mood and affect, insight and judgment.  Breast:  Right breast w/palpable firm 3-4 cm circular smooth border mass between 9-12 o'clock that starts lateral mid breast and extends to aerola; mild assymetry.    Adela Glimpse, NP 9:39 AM [facility]

## 2023-03-29 NOTE — Patient Instructions (Signed)

## 2023-04-02 ENCOUNTER — Encounter: Payer: Self-pay | Admitting: Nurse Practitioner

## 2023-04-10 ENCOUNTER — Ambulatory Visit
Admission: RE | Admit: 2023-04-10 | Discharge: 2023-04-10 | Disposition: A | Discharge status: Home / Self Care | Payer: 59 | Source: Ambulatory Visit | Attending: Nurse Practitioner | Admitting: Nurse Practitioner

## 2023-04-10 ENCOUNTER — Ambulatory Visit
Admission: RE | Admit: 2023-04-10 | Discharge: 2023-04-10 | Disposition: A | Discharge status: Home / Self Care | Payer: Medicaid Other | Source: Ambulatory Visit | Attending: Nurse Practitioner | Admitting: Nurse Practitioner

## 2023-04-10 ENCOUNTER — Other Ambulatory Visit: Payer: Self-pay | Admitting: Nurse Practitioner

## 2023-04-10 DIAGNOSIS — R599 Enlarged lymph nodes, unspecified: Secondary | ICD-10-CM

## 2023-04-10 DIAGNOSIS — N6312 Unspecified lump in the right breast, upper inner quadrant: Secondary | ICD-10-CM

## 2023-04-10 DIAGNOSIS — Z803 Family history of malignant neoplasm of breast: Secondary | ICD-10-CM

## 2023-04-11 ENCOUNTER — Encounter: Payer: Self-pay | Admitting: Nurse Practitioner

## 2023-04-11 ENCOUNTER — Other Ambulatory Visit: Payer: Self-pay | Admitting: Nurse Practitioner

## 2023-04-13 ENCOUNTER — Ambulatory Visit
Admission: RE | Admit: 2023-04-13 | Discharge: 2023-04-13 | Disposition: A | Discharge status: Home / Self Care | Payer: 59 | Source: Ambulatory Visit | Attending: Nurse Practitioner | Admitting: Nurse Practitioner

## 2023-04-13 DIAGNOSIS — R599 Enlarged lymph nodes, unspecified: Secondary | ICD-10-CM

## 2023-04-13 DIAGNOSIS — N6312 Unspecified lump in the right breast, upper inner quadrant: Secondary | ICD-10-CM

## 2023-04-13 DIAGNOSIS — N6311 Unspecified lump in the right breast, upper outer quadrant: Secondary | ICD-10-CM

## 2023-04-13 HISTORY — PX: BREAST BIOPSY: SHX20

## 2023-04-16 ENCOUNTER — Telehealth: Payer: Self-pay | Admitting: Internal Medicine

## 2023-04-16 ENCOUNTER — Other Ambulatory Visit: Payer: Self-pay | Admitting: Nurse Practitioner

## 2023-04-16 DIAGNOSIS — N6312 Unspecified lump in the right breast, upper inner quadrant: Secondary | ICD-10-CM

## 2023-04-16 LAB — SURGICAL PATHOLOGY

## 2023-04-16 NOTE — Telephone Encounter (Signed)
patient called to request referral to [facility]. Patient states this is near her home. Called, lvm for Vanessa Lee, Oncology Navigator [facility]. Requested Adela Glimpse enter referrals to oncology and general surgery. Advised patient I will call her back once referral have been sent and appointments scheduled.

## 2023-04-17 NOTE — Telephone Encounter (Signed)
Referral faxed and emailed to [facility]. Patient aware.

## 2023-04-25 ENCOUNTER — Other Ambulatory Visit: Payer: Self-pay | Admitting: Nurse Practitioner

## 2023-04-25 DIAGNOSIS — F411 Generalized anxiety disorder: Secondary | ICD-10-CM

## 2023-04-25 MED ORDER — AMPHETAMINE-DEXTROAMPHETAMINE 20 MG PO TABS: ORAL_TABLET | ORAL | 0 refills | Status: DC

## 2023-04-25 MED ORDER — ALPRAZOLAM 0.5 MG PO TABS
ORAL_TABLET | ORAL | 0 refills | Status: DC
Start: 2023-04-25 — End: 2023-06-22

## 2023-05-26 ENCOUNTER — Other Ambulatory Visit: Payer: Self-pay | Admitting: Nurse Practitioner

## 2023-05-26 DIAGNOSIS — F411 Generalized anxiety disorder: Secondary | ICD-10-CM

## 2023-05-28 MED ORDER — AMPHETAMINE-DEXTROAMPHETAMINE 20 MG PO TABS: ORAL_TABLET | ORAL | 0 refills | Status: DC

## 2023-06-12 ENCOUNTER — Encounter: Payer: Self-pay | Admitting: Nurse Practitioner

## 2023-06-12 DIAGNOSIS — F419 Anxiety disorder, unspecified: Secondary | ICD-10-CM

## 2023-06-12 MED ORDER — ESCITALOPRAM OXALATE 10 MG PO TABS
ORAL_TABLET | ORAL | 3 refills | Status: DC
Start: 2023-06-12 — End: 2023-08-22

## 2023-06-15 ENCOUNTER — Other Ambulatory Visit: Payer: Self-pay | Admitting: Nurse Practitioner

## 2023-06-15 DIAGNOSIS — F411 Generalized anxiety disorder: Secondary | ICD-10-CM

## 2023-06-19 ENCOUNTER — Other Ambulatory Visit: Payer: Self-pay | Admitting: Nurse Practitioner

## 2023-06-19 DIAGNOSIS — F411 Generalized anxiety disorder: Secondary | ICD-10-CM

## 2023-06-22 ENCOUNTER — Other Ambulatory Visit: Payer: Self-pay | Admitting: Nurse Practitioner

## 2023-06-22 DIAGNOSIS — F411 Generalized anxiety disorder: Secondary | ICD-10-CM

## 2023-06-25 ENCOUNTER — Other Ambulatory Visit: Payer: Self-pay | Admitting: Nurse Practitioner

## 2023-06-25 DIAGNOSIS — M153 Secondary multiple arthritis: Secondary | ICD-10-CM

## 2023-06-25 DIAGNOSIS — Z0001 Encounter for general adult medical examination with abnormal findings: Secondary | ICD-10-CM

## 2023-06-25 DIAGNOSIS — Z79899 Other long term (current) drug therapy: Secondary | ICD-10-CM

## 2023-06-25 DIAGNOSIS — R768 Other specified abnormal immunological findings in serum: Secondary | ICD-10-CM

## 2023-06-25 MED ORDER — ALPRAZOLAM 0.5 MG PO TABS
ORAL_TABLET | ORAL | 0 refills | Status: DC
Start: 2023-06-25 — End: 2023-08-29

## 2023-07-02 ENCOUNTER — Other Ambulatory Visit: Payer: Self-pay | Admitting: Nurse Practitioner

## 2023-07-02 MED ORDER — AMPHETAMINE-DEXTROAMPHETAMINE 20 MG PO TABS: ORAL_TABLET | ORAL | 0 refills | Status: DC

## 2023-07-23 ENCOUNTER — Other Ambulatory Visit: Payer: Self-pay | Admitting: Nurse Practitioner

## 2023-07-23 DIAGNOSIS — Z0001 Encounter for general adult medical examination with abnormal findings: Secondary | ICD-10-CM

## 2023-07-23 DIAGNOSIS — R768 Other specified abnormal immunological findings in serum: Secondary | ICD-10-CM

## 2023-07-23 DIAGNOSIS — M153 Secondary multiple arthritis: Secondary | ICD-10-CM

## 2023-07-23 DIAGNOSIS — Z79899 Other long term (current) drug therapy: Secondary | ICD-10-CM

## 2023-07-26 ENCOUNTER — Ambulatory Visit: Payer: 59 | Admitting: Nurse Practitioner

## 2023-08-22 ENCOUNTER — Other Ambulatory Visit: Payer: Self-pay | Admitting: Nurse Practitioner

## 2023-08-22 DIAGNOSIS — F419 Anxiety disorder, unspecified: Secondary | ICD-10-CM

## 2023-08-29 ENCOUNTER — Other Ambulatory Visit: Payer: Self-pay

## 2023-08-29 DIAGNOSIS — F411 Generalized anxiety disorder: Secondary | ICD-10-CM

## 2023-08-29 DIAGNOSIS — Z79899 Other long term (current) drug therapy: Secondary | ICD-10-CM

## 2023-08-29 DIAGNOSIS — M153 Secondary multiple arthritis: Secondary | ICD-10-CM

## 2023-08-29 DIAGNOSIS — R768 Other specified abnormal immunological findings in serum: Secondary | ICD-10-CM

## 2023-08-29 DIAGNOSIS — Z0001 Encounter for general adult medical examination with abnormal findings: Secondary | ICD-10-CM

## 2023-08-29 MED ORDER — ALPRAZOLAM 0.5 MG PO TABS
ORAL_TABLET | ORAL | 0 refills | Status: AC
Start: 2023-08-29 — End: ?

## 2023-08-29 MED ORDER — MELOXICAM 7.5 MG PO TABS
7.5000 mg | ORAL_TABLET | Freq: Every day | ORAL | 0 refills | Status: DC
Start: 2023-08-29 — End: 2023-09-11

## 2023-08-31 ENCOUNTER — Other Ambulatory Visit: Payer: Self-pay | Admitting: Nurse Practitioner

## 2023-08-31 MED ORDER — AMPHETAMINE-DEXTROAMPHETAMINE 20 MG PO TABS: ORAL_TABLET | ORAL | 0 refills | Status: AC

## 2023-09-11 ENCOUNTER — Other Ambulatory Visit: Payer: Self-pay

## 2023-09-11 DIAGNOSIS — R768 Other specified abnormal immunological findings in serum: Secondary | ICD-10-CM

## 2023-09-11 DIAGNOSIS — Z0001 Encounter for general adult medical examination with abnormal findings: Secondary | ICD-10-CM

## 2023-09-11 DIAGNOSIS — M153 Secondary multiple arthritis: Secondary | ICD-10-CM

## 2023-09-11 DIAGNOSIS — Z79899 Other long term (current) drug therapy: Secondary | ICD-10-CM

## 2023-09-11 DIAGNOSIS — R7689 Other specified abnormal immunological findings in serum: Secondary | ICD-10-CM

## 2023-09-11 MED ORDER — MELOXICAM 7.5 MG PO TABS: 7.5000 mg | ORAL_TABLET | Freq: Every day | ORAL | 1 refills | Status: AC

## 2023-12-25 ENCOUNTER — Encounter: Payer: 59 | Admitting: Nurse Practitioner

## 2024-01-22 ENCOUNTER — Encounter: Payer: 59 | Admitting: Nurse Practitioner
